# Patient Record
Sex: Female | Born: 1935 | Race: White | Hispanic: No | Smoking: Never smoker
Health system: Southern US, Community
[De-identification: ages and names within clinical notes are randomized; demographics above are authoritative.]

## PROBLEM LIST (undated history)

## (undated) DIAGNOSIS — I2699 Other pulmonary embolism without acute cor pulmonale: Secondary | ICD-10-CM

## (undated) DIAGNOSIS — G20A1 Parkinson's disease without dyskinesia, without mention of fluctuations: Secondary | ICD-10-CM

## (undated) DIAGNOSIS — F419 Anxiety disorder, unspecified: Secondary | ICD-10-CM

## (undated) DIAGNOSIS — I4891 Unspecified atrial fibrillation: Secondary | ICD-10-CM

## (undated) DIAGNOSIS — H3530 Unspecified macular degeneration: Secondary | ICD-10-CM

## (undated) DIAGNOSIS — G20 Parkinson's disease: Secondary | ICD-10-CM

## (undated) HISTORY — PX: EXTERNAL FIXATION ARM: SHX1552

## (undated) HISTORY — PX: CATARACT EXTRACTION: SUR2

---

## 2015-11-19 HISTORY — PX: BACK SURGERY: SHX140

## 2017-01-30 ENCOUNTER — Emergency Department (HOSPITAL_COMMUNITY): Payer: Medicare Other

## 2017-01-30 ENCOUNTER — Encounter (HOSPITAL_COMMUNITY): Payer: Self-pay | Admitting: Emergency Medicine

## 2017-01-30 ENCOUNTER — Observation Stay (HOSPITAL_COMMUNITY)
Admission: EM | Admit: 2017-01-30 | Discharge: 2017-02-01 | Disposition: A | Discharge status: Home / Self Care | Payer: Medicare Other | Attending: Internal Medicine | Admitting: Internal Medicine

## 2017-01-30 DIAGNOSIS — Z7901 Long term (current) use of anticoagulants: Secondary | ICD-10-CM | POA: Insufficient documentation

## 2017-01-30 DIAGNOSIS — Z86711 Personal history of pulmonary embolism: Secondary | ICD-10-CM | POA: Diagnosis not present

## 2017-01-30 DIAGNOSIS — H3530 Unspecified macular degeneration: Secondary | ICD-10-CM | POA: Insufficient documentation

## 2017-01-30 DIAGNOSIS — R233 Spontaneous ecchymoses: Secondary | ICD-10-CM | POA: Diagnosis not present

## 2017-01-30 DIAGNOSIS — Z79899 Other long term (current) drug therapy: Secondary | ICD-10-CM | POA: Diagnosis not present

## 2017-01-30 DIAGNOSIS — G20A1 Parkinson's disease without dyskinesia, without mention of fluctuations: Secondary | ICD-10-CM | POA: Diagnosis present

## 2017-01-30 DIAGNOSIS — F419 Anxiety disorder, unspecified: Secondary | ICD-10-CM | POA: Diagnosis not present

## 2017-01-30 DIAGNOSIS — R05 Cough: Secondary | ICD-10-CM | POA: Diagnosis present

## 2017-01-30 DIAGNOSIS — Z8249 Family history of ischemic heart disease and other diseases of the circulatory system: Secondary | ICD-10-CM | POA: Insufficient documentation

## 2017-01-30 DIAGNOSIS — R29898 Other symptoms and signs involving the musculoskeletal system: Secondary | ICD-10-CM | POA: Diagnosis present

## 2017-01-30 DIAGNOSIS — Z823 Family history of stroke: Secondary | ICD-10-CM | POA: Diagnosis not present

## 2017-01-30 DIAGNOSIS — G20 Parkinson's disease: Secondary | ICD-10-CM | POA: Insufficient documentation

## 2017-01-30 DIAGNOSIS — R4701 Aphasia: Secondary | ICD-10-CM | POA: Insufficient documentation

## 2017-01-30 DIAGNOSIS — G249 Dystonia, unspecified: Principal | ICD-10-CM | POA: Insufficient documentation

## 2017-01-30 DIAGNOSIS — I480 Paroxysmal atrial fibrillation: Secondary | ICD-10-CM | POA: Diagnosis not present

## 2017-01-30 DIAGNOSIS — R61 Generalized hyperhidrosis: Secondary | ICD-10-CM | POA: Diagnosis not present

## 2017-01-30 DIAGNOSIS — R059 Cough, unspecified: Secondary | ICD-10-CM

## 2017-01-30 HISTORY — DX: Unspecified macular degeneration: H35.30

## 2017-01-30 HISTORY — DX: Anxiety disorder, unspecified: F41.9

## 2017-01-30 HISTORY — DX: Other pulmonary embolism without acute cor pulmonale: I26.99

## 2017-01-30 HISTORY — DX: Parkinson's disease: G20

## 2017-01-30 HISTORY — DX: Parkinson's disease without dyskinesia, without mention of fluctuations: G20.A1

## 2017-01-30 HISTORY — DX: Unspecified atrial fibrillation: I48.91

## 2017-01-30 LAB — COMPREHENSIVE METABOLIC PANEL
ALT: 5 U/L — ABNORMAL LOW (ref 14–54)
ANION GAP: 8 (ref 5–15)
AST: 18 U/L (ref 15–41)
Albumin: 3.9 g/dL (ref 3.5–5.0)
Alkaline Phosphatase: 80 U/L (ref 38–126)
BILIRUBIN TOTAL: 0.5 mg/dL (ref 0.3–1.2)
BUN: 14 mg/dL (ref 6–20)
CO2: 25 mmol/L (ref 22–32)
Calcium: 9 mg/dL (ref 8.9–10.3)
Chloride: 106 mmol/L (ref 101–111)
Creatinine, Ser: 0.61 mg/dL (ref 0.44–1.00)
Glucose, Bld: 139 mg/dL — ABNORMAL HIGH (ref 65–99)
POTASSIUM: 4.3 mmol/L (ref 3.5–5.1)
Sodium: 139 mmol/L (ref 135–145)
TOTAL PROTEIN: 6.5 g/dL (ref 6.5–8.1)

## 2017-01-30 LAB — CBC WITH DIFFERENTIAL/PLATELET
Basophils Absolute: 0 10*3/uL (ref 0.0–0.1)
Basophils Relative: 1 %
EOS PCT: 1 %
Eosinophils Absolute: 0.1 10*3/uL (ref 0.0–0.7)
HEMATOCRIT: 33.5 % — AB (ref 36.0–46.0)
Hemoglobin: 11.1 g/dL — ABNORMAL LOW (ref 12.0–15.0)
LYMPHS PCT: 35 %
Lymphs Abs: 1.6 10*3/uL (ref 0.7–4.0)
MCH: 32.8 pg (ref 26.0–34.0)
MCHC: 33.1 g/dL (ref 30.0–36.0)
MCV: 99.1 fL (ref 78.0–100.0)
MONO ABS: 0.4 10*3/uL (ref 0.1–1.0)
MONOS PCT: 8 %
NEUTROS ABS: 2.5 10*3/uL (ref 1.7–7.7)
Neutrophils Relative %: 55 %
PLATELETS: 244 10*3/uL (ref 150–400)
RBC: 3.38 MIL/uL — ABNORMAL LOW (ref 3.87–5.11)
RDW: 14.4 % (ref 11.5–15.5)
WBC: 4.5 10*3/uL (ref 4.0–10.5)

## 2017-01-30 LAB — I-STAT CG4 LACTIC ACID, ED: LACTIC ACID, VENOUS: 0.76 mmol/L (ref 0.5–1.9)

## 2017-01-30 LAB — I-STAT CHEM 8, ED
BUN: 16 mg/dL (ref 6–20)
CREATININE: 0.6 mg/dL (ref 0.44–1.00)
Calcium, Ion: 1.1 mmol/L — ABNORMAL LOW (ref 1.15–1.40)
Chloride: 104 mmol/L (ref 101–111)
Glucose, Bld: 142 mg/dL — ABNORMAL HIGH (ref 65–99)
HEMATOCRIT: 32 % — AB (ref 36.0–46.0)
Hemoglobin: 10.9 g/dL — ABNORMAL LOW (ref 12.0–15.0)
POTASSIUM: 4.3 mmol/L (ref 3.5–5.1)
Sodium: 141 mmol/L (ref 135–145)
TCO2: 26 mmol/L (ref 0–100)

## 2017-01-30 LAB — I-STAT TROPONIN, ED: TROPONIN I, POC: 0 ng/mL (ref 0.00–0.08)

## 2017-01-30 MED ORDER — DIPHENHYDRAMINE HCL 50 MG/ML IJ SOLN
25.0000 mg | Freq: Once | INTRAMUSCULAR | Status: AC
  Administered 2017-01-30: 25 mg via INTRAVENOUS
  Filled 2017-01-30: qty 1

## 2017-01-30 MED ORDER — LORAZEPAM 2 MG/ML IJ SOLN: 1.0000 mg | Freq: Once | INTRAMUSCULAR | Status: DC

## 2017-01-30 NOTE — ED Provider Notes (Signed)
I saw and evaluated the patient, reviewed the resident's note and I agree with the findings and plan with the following exceptions.   81 year old female with Parkinson's here with spasms of her face and legs that started after taking Sinemet. States this has happened before with dosing as well. On exam here she does seem to have some dystonia of her lower extremity is better face appears normal. After discussion with neurology, plan will be for workup to make sure no other causes for altered mental status otherwise treated with Benadryl and reassess.   EKG Interpretation  Date/Time:  Thursday January 30 2017 23:55:46 EDT Ventricular Rate:  109 PR Interval:    QRS Duration: 69 QT Interval:  315 QTC Calculation: 425 R Axis:   71 Text Interpretation:  Sinus tachycardia Anterior infarct, old No previous tracing Confirmed by POLLINA  MD, CHRISTOPHER [phone]([id number]) on 01/31/2017 10:13:16 PM         Marily MemosJason Charletha Dalpe, MD 01/31/17 [id number]

## 2017-01-30 NOTE — ED Notes (Signed)
Pt refusing bloodwork at this time

## 2017-01-30 NOTE — ED Triage Notes (Signed)
Per EMS, pt from [facility], with c/o muscle rigidity while standing, pt also c/o mild headache which has resolved PTA and mild aphasia. Pt with hx of parkinson's, she states that these symptoms are normally associated with her parkinson's medications. Pt A&O x 4. BP-171/86, HR-90, RR-20, SpO2-98% room air.

## 2017-01-30 NOTE — ED Provider Notes (Signed)
[facility]-EMERGENCY DEPT Provider Note   CSN: [id number] Arrival date & time: 01/30/17  1854     History   Chief Complaint Chief Complaint  Patient presents with  . Spasms    HPI Nichole Krueger is a 81 y.o. female PMH Parkinson's disease, Afib p/w dystonic reaction. Pt reports Sinemet dosage increased one week ago. This evening she took her dose at 1700 at scheduled and approx 20 minutes later began to experience facial distortion, increased rigidity and contracture of b/l LE. Pt reports hx of similar reaction that normally dissipates after 30 min to 3 hours. Symptoms have stayed constant and patient presented to ED for further care.   The history is provided by the patient.  Neurologic Problem  This is a recurrent problem. The current episode started 1 to 2 hours ago. The problem occurs constantly. The problem has not changed since onset.Pertinent negatives include no chest pain, no abdominal pain, no headaches and no shortness of breath. Nothing relieves the symptoms.    Past Medical History:  Diagnosis Date  . Anxiety   . Atrial fibrillation (HCC)   . Macular degeneration   . Parkinson's disease (HCC)   . Pulmonary embolism [facility])     Patient Active Problem List   Diagnosis Date Noted  . Muscle rigidity 01/31/2017  . AF (paroxysmal atrial fibrillation) (HCC) 01/31/2017  . Parkinson's disease (HCC) 01/31/2017    Past Surgical History:  Procedure Laterality Date  . BACK SURGERY      OB History    No data available       Home Medications    Prior to Admission medications   Medication Sig Start Date End Date Taking? Authorizing Provider  Apixaban (ELIQUIS PO) Take by mouth.   Yes Historical Provider, MD  ALPRAZolam Prudy Feeler) 0.25 MG tablet  12/03/16   Historical Provider, MD  carbidopa-levodopa (SINEMET IR) 25-100 MG tablet  01/07/17   Historical Provider, MD  pramipexole (MIRAPEX) 1 MG tablet TK 1 T PO QD 01/08/17   Historical Provider, MD  selegiline (ELDEPRYL) 5 MG  capsule TK ONE C PO BID 01/03/17   Historical Provider, MD    Family History Family History  Problem Relation Age of Onset  . Stroke Mother   . Heart attack Father   . Heart attack Sister     Social History Social History  Substance Use Topics  . Smoking status: Never Smoker  . Smokeless tobacco: Never Used  . Alcohol use No     Allergies   Patient has no known allergies.   Review of Systems Review of Systems  Eyes: Negative for visual disturbance.  Respiratory: Negative for shortness of breath.   Cardiovascular: Negative for chest pain.  Gastrointestinal: Negative for abdominal pain.  Neurological: Positive for tremors. Negative for seizures, syncope, facial asymmetry, speech difficulty, weakness, light-headedness and headaches.       Rigidity and LE contractures  All other systems reviewed and are negative.    Physical Exam Updated Vital Signs BP (!) 146/70   Pulse 83   Temp 98.3 F (36.8 C) (Oral)   Resp 16   Ht 5\' 5"  (1.651 m)   Wt 52.2 kg   SpO2 97%   BMI 19.14 kg/m   Physical Exam  Constitutional: She is oriented to person, place, and time. She appears well-developed and well-nourished. She appears distressed.  HENT:  Head: Normocephalic and atraumatic.  Eyes: Conjunctivae and EOM are normal. Pupils are equal, round, and reactive to light.  Neck: Normal range  of motion. Neck supple.  Cardiovascular: Normal rate and regular rhythm.   Pulmonary/Chest: Effort normal and breath sounds normal. No respiratory distress.  Abdominal: Soft. There is no tenderness.  Neurological: She is alert and oriented to person, place, and time. No cranial nerve deficit. GCS eye subscore is 4. GCS verbal subscore is 5. GCS motor subscore is 6.  Resting tremor UE R>L. Increased tone throughout extremities with noted contracture b/l feet  Skin: Skin is warm and dry. She is not diaphoretic.  Psychiatric: She has a normal mood and affect.  Nursing note and vitals  reviewed.    ED Treatments / Results  Labs (all labs ordered are listed, but only abnormal results are displayed) Labs Reviewed  CBC WITH DIFFERENTIAL/PLATELET - Abnormal; Notable for the following:       Result Value   RBC 3.38 (*)    Hemoglobin 11.1 (*)    HCT 33.5 (*)    All other components within normal limits  COMPREHENSIVE METABOLIC PANEL - Abnormal; Notable for the following:    Glucose, Bld 139 (*)    ALT 5 (*)    All other components within normal limits  I-STAT CHEM 8, ED - Abnormal; Notable for the following:    Glucose, Bld 142 (*)    Calcium, Ion 1.10 (*)    Hemoglobin 10.9 (*)    HCT 32.0 (*)    All other components within normal limits  URINALYSIS, ROUTINE W REFLEX MICROSCOPIC  I-STAT TROPOININ, ED  I-STAT CG4 LACTIC ACID, ED    EKG  EKG Interpretation None       Radiology Dg Chest 2 View  Result Date: 01/31/2017 CLINICAL DATA:  Cough, headache, and muscle rigidity. Mild aphasia. History of Parkinson's. EXAM: CHEST  2 VIEW COMPARISON:  None. FINDINGS: Emphysematous changes in the lungs. Normal heart size and pulmonary vascularity. No focal airspace disease or consolidation. No blunting of costophrenic angles. No pneumothorax. Mediastinal contours appear intact. Postoperative changes with posterior fixation of the thoracolumbar spine. Multiple vertebral compression deformities with kyphoplasty changes. IMPRESSION: Emphysematous changes in the lungs. No evidence of active pulmonary disease. Electronically Signed   By: Burman Nieves M.D.   On: 01/31/2017 01:37   Ct Head Wo Contrast  Result Date: 01/30/2017 CLINICAL DATA:  Altered mental status. Muscle rigidity while standing. Mild headache and mild aphasia. History of Parkinson's. EXAM: CT HEAD WITHOUT CONTRAST TECHNIQUE: Contiguous axial images were obtained from the base of the skull through the vertex without intravenous contrast. COMPARISON:  None. FINDINGS: Brain: Examination is limited due to motion  and streak artifact. There is suggestion of some focal low-attenuation in the left posterior temporal region. This is probably due to streak artifact but does appear somewhat asymmetric and an early area of acute infarct cannot be excluded. Mild cerebral atrophy. Ventricles and sulci appear symmetrical otherwise. No mass effect or midline shift. No abnormal extra-axial fluid collections. Gray-white matter junctions are distinct. Basal cisterns are not effaced. No acute intracranial hemorrhage is identified. Vascular: Vascular calcifications are present. Skull: Calvarium appears intact. Sinuses/Orbits: Paranasal sinuses and mastoid air cells are clear. Other: None. IMPRESSION: Focal low-attenuation in the left posterior temporal region is probably due to streak artifact but early acute infarct is not entirely excluded. Otherwise, no evidence of acute intracranial abnormality. Mild atrophy. Electronically Signed   By: Burman Nieves M.D.   On: 01/30/2017 22:37    Procedures Procedures (including critical care time)  Medications Ordered in ED Medications  LORazepam (ATIVAN) injection 1  mg (not administered)  diphenhydrAMINE (BENADRYL) injection 25 mg (25 mg Intravenous Given 01/30/17 2252)  diphenhydrAMINE (BENADRYL) injection 25 mg (25 mg Intravenous Given 01/30/17 2331)  carbidopa-levodopa (SINEMET IR) 25-100 MG per tablet immediate release 2 tablet (2 tablets Oral Given 01/31/17 0049)     Initial Impression / Assessment and Plan / ED Course  I have reviewed the triage vital signs and the nursing notes.  Pertinent labs & imaging results that were available during my care of the patient were reviewed by me and considered in my medical decision making (see chart for details).    81 y.o. female p/w dystonic reaction. Pt is a poor historian. Concern for AMS vs Sinemet toxicity.  - CT head NAICA - CBC, CMP, lactic acid, troponin unremarkable - Given IV benadryl w/o relief Consult to neurology,  advise likely "off" period dystonia; advise Sinemet and reassess. Medication ordered. Will admit to Hospitalist for monitoring if ineffective. If medication resolved symptoms will discharge home with close Neurology follow-up. [facility].   Discussed with my attending physician, Dr Clayborne DanaMesner.  Final Clinical Impressions(s) / ED Diagnoses   Final diagnoses:  Cough    New Prescriptions New Prescriptions   No medications on file     Pablo LedgerElizabeth Mitchell Adriyanna Christians, MD 01/31/17 [id number]    Marily MemosJason Mesner, MD 01/31/17 [id number]

## 2017-01-31 ENCOUNTER — Encounter (HOSPITAL_COMMUNITY): Payer: Self-pay | Admitting: Family Medicine

## 2017-01-31 ENCOUNTER — Observation Stay (HOSPITAL_COMMUNITY): Payer: Medicare Other

## 2017-01-31 DIAGNOSIS — R05 Cough: Secondary | ICD-10-CM

## 2017-01-31 DIAGNOSIS — G20A1 Parkinson's disease without dyskinesia, without mention of fluctuations: Secondary | ICD-10-CM | POA: Diagnosis present

## 2017-01-31 DIAGNOSIS — G249 Dystonia, unspecified: Secondary | ICD-10-CM | POA: Diagnosis not present

## 2017-01-31 DIAGNOSIS — R29898 Other symptoms and signs involving the musculoskeletal system: Secondary | ICD-10-CM | POA: Diagnosis not present

## 2017-01-31 DIAGNOSIS — G20 Parkinson's disease: Secondary | ICD-10-CM | POA: Diagnosis present

## 2017-01-31 DIAGNOSIS — I480 Paroxysmal atrial fibrillation: Secondary | ICD-10-CM | POA: Diagnosis not present

## 2017-01-31 LAB — COMPREHENSIVE METABOLIC PANEL
AST: 15 U/L (ref 15–41)
Albumin: 3.3 g/dL — ABNORMAL LOW (ref 3.5–5.0)
Alkaline Phosphatase: 67 U/L (ref 38–126)
Anion gap: 9 (ref 5–15)
BILIRUBIN TOTAL: 0.6 mg/dL (ref 0.3–1.2)
BUN: 12 mg/dL (ref 6–20)
CO2: 28 mmol/L (ref 22–32)
CREATININE: 0.63 mg/dL (ref 0.44–1.00)
Calcium: 8.8 mg/dL — ABNORMAL LOW (ref 8.9–10.3)
Chloride: 106 mmol/L (ref 101–111)
GFR calc Af Amer: >60 mL/min (ref >60)
GFR calc non Af Amer: >60 mL/min (ref >60)
Glucose, Bld: 113 mg/dL — ABNORMAL HIGH (ref 65–99)
Potassium: 4.1 mmol/L (ref 3.5–5.1)
Sodium: 143 mmol/L (ref 135–145)
TOTAL PROTEIN: 5.7 g/dL — AB (ref 6.5–8.1)

## 2017-01-31 LAB — CBC
HCT: 30.8 % — ABNORMAL LOW (ref 36.0–46.0)
Hemoglobin: 10.2 g/dL — ABNORMAL LOW (ref 12.0–15.0)
MCH: 33.1 pg (ref 26.0–34.0)
MCHC: 33.1 g/dL (ref 30.0–36.0)
MCV: 100 fL (ref 78.0–100.0)
PLATELETS: 195 10*3/uL (ref 150–400)
RBC: 3.08 MIL/uL — ABNORMAL LOW (ref 3.87–5.11)
RDW: 14.5 % (ref 11.5–15.5)
WBC: 4.4 10*3/uL (ref 4.0–10.5)

## 2017-01-31 LAB — PROTIME-INR
INR: 1.18
PROTHROMBIN TIME: 15.1 s (ref 11.4–15.2)

## 2017-01-31 MED ORDER — APIXABAN 5 MG PO TABS
5.0000 mg | ORAL_TABLET | Freq: Two times a day (BID) | ORAL | Status: DC
  Administered 2017-01-31 – 2017-02-01 (×3): 5 mg via ORAL
  Filled 2017-01-31 (×3): qty 1

## 2017-01-31 MED ORDER — CARBIDOPA-LEVODOPA 25-100 MG PO TABS: 0.5000 | ORAL_TABLET | Freq: Every day | ORAL | Status: DC

## 2017-01-31 MED ORDER — LORAZEPAM 2 MG/ML IJ SOLN
1.0000 mg | INTRAMUSCULAR | Status: DC | PRN
  Administered 2017-01-31: 1 mg via INTRAVENOUS
  Filled 2017-01-31: qty 1

## 2017-01-31 MED ORDER — CARBIDOPA-LEVODOPA 25-100 MG PO TABS
2.0000 | ORAL_TABLET | Freq: Four times a day (QID) | ORAL | Status: DC
  Administered 2017-01-31 – 2017-02-01 (×7): 2 via ORAL
  Filled 2017-01-31 (×7): qty 2

## 2017-01-31 MED ORDER — CARBIDOPA-LEVODOPA 25-100 MG PO TABS
1.0000 | ORAL_TABLET | Freq: Every day | ORAL | Status: DC
  Administered 2017-01-31: 1 via ORAL
  Filled 2017-01-31: qty 1

## 2017-01-31 MED ORDER — CARBIDOPA-LEVODOPA 25-100 MG PO TABS
2.0000 | ORAL_TABLET | Freq: Once | ORAL | Status: AC
  Administered 2017-01-31: 2 via ORAL
  Filled 2017-01-31: qty 2

## 2017-01-31 MED ORDER — CARBIDOPA-LEVODOPA ER 50-200 MG PO TBCR
1.0000 | EXTENDED_RELEASE_TABLET | Freq: Every day | ORAL | Status: DC
  Administered 2017-01-31: 1 via ORAL
  Filled 2017-01-31: qty 1

## 2017-01-31 MED ORDER — ACETAMINOPHEN 325 MG PO TABS
650.0000 mg | ORAL_TABLET | Freq: Four times a day (QID) | ORAL | Status: DC | PRN
  Administered 2017-01-31: 650 mg via ORAL
  Filled 2017-01-31: qty 2

## 2017-01-31 MED ORDER — OXYCODONE-ACETAMINOPHEN 5-325 MG PO TABS
1.0000 | ORAL_TABLET | ORAL | Status: DC | PRN
  Administered 2017-01-31: 1 via ORAL
  Filled 2017-01-31: qty 1

## 2017-01-31 MED ORDER — ACETAMINOPHEN 650 MG RE SUPP: 650.0000 mg | Freq: Four times a day (QID) | RECTAL | Status: DC | PRN

## 2017-01-31 MED ORDER — SODIUM CHLORIDE 0.9 % IV SOLN
INTRAVENOUS | Status: DC
  Administered 2017-01-31 (×2): via INTRAVENOUS
  Administered 2017-01-31: 1000 mL via INTRAVENOUS
  Administered 2017-02-01: 09:00:00 via INTRAVENOUS

## 2017-01-31 MED ORDER — BACLOFEN 10 MG PO TABS: 10.0000 mg | ORAL_TABLET | Freq: Three times a day (TID) | ORAL | Status: DC | PRN

## 2017-01-31 MED ORDER — LORAZEPAM 2 MG/ML IJ SOLN: 1.0000 mg | Freq: Once | INTRAMUSCULAR | Status: DC

## 2017-01-31 MED ORDER — PRAMIPEXOLE DIHYDROCHLORIDE 0.25 MG PO TABS
0.2500 mg | ORAL_TABLET | Freq: Four times a day (QID) | ORAL | Status: DC
  Administered 2017-01-31 – 2017-02-01 (×6): 0.25 mg via ORAL
  Filled 2017-01-31 (×7): qty 1

## 2017-01-31 NOTE — Progress Notes (Signed)
Pt arrived to 5w30 accompanied by ED Nurse.   Alert and oriented x 4, VS collected, no signs of acute distress. Patient advised about valuable policy and oriented to room and equipment.  Bed alarm placed and pt instructed to use call light for any assistance.   Call light within reach.   Will continue to monitor and treat patient.

## 2017-01-31 NOTE — Progress Notes (Signed)
[facility] PLAN OF CARE NOTE Patient: Nichole Krueger XBM:[id number]   PCP: Ginette OttoSTONEKING,HAL THOMAS, MD DOB: 17-Jun-1936   DOA: 01/30/2017   DOS: 01/31/2017    Patient was admitted by my colleague Dr. Maryfrances Bunnellanford earlier on 01/31/2017. I have reviewed the H&P as well as assessment and plan and agree with the same. Important changes in the plan are listed below.  Plan of care: Principal Problem:   Muscle rigidity Active Problems:   AF (paroxysmal atrial fibrillation) (HCC)   Parkinson's disease (HCC) Continue medical treatment recommended by neurology.  Author: Lynden OxfordPranav Tanekia Ryans, MD [facility] Pager: [phone] 01/31/2017 4:55 PM   If 7PM-7AM, please contact night-coverage at [email or web address], password [email or web address]

## 2017-01-31 NOTE — H&P (Signed)
History and Physical  Patient Name: Nichole Krueger     GLO:[id number]    DOB: [birth year]    DOA: 01/30/2017 PCP: Ginette OttoSTONEKING,HAL THOMAS, MD   [facility]  Chief Complaint: Rigidity  HPI: Nichole Krueger is a [age] y.o. female with a past medical history significant for Parkinson's disease, atrial fibrillation and recurrent DVT on Eliquis who presents with rigidity tonght.  The patient recently had her dose of Sinemet increased.  Tonight, shortly after taking her nightly Sinemet, she developed painful tightening of the muscles in her legs and also spasms in her face.  She assumed this was related to her Sinemet, because she has had similar episodes in the past, so she came to the ER.  ED course: -Afebrile, heart rate 91, respirations and pulse ox normal, BP 148/84 -Na 139, K 4.3, Cr 0.61, LFTs normal, WBC 4.5K, Hgb 11.1 -Troponin negative, lactic acid normal -CT head showed streak artifact vs infarct, latter doubted by Neurology -Initially the consideration was that this was a peak dose dystonia, but when diphenhydramine did not improve symptoms, it was thought that maybe she missed her dose by accident and this was "wearing off" bradykinesis and so she was given an extra dose of Sinemet and TR[facility] were asked to observe the patient overnight    [place] [facility] [place].  She lives in independent living at [facility].  In addition to her recent dose increase, she notes a new cough recently, with some sputum production.  She has had no fevers, chills, dysuria, urinary frequency other urinary symptoms.  Currently, she notes malaise, and muscle rigidity, and seems somewhat agitated, but has no focal complaints.       ROS: Review of Systems  Constitutional: Positive for diaphoresis. Negative for chills and fever.  Respiratory: Positive for cough and sputum production. Negative for shortness of breath  and wheezing.   Cardiovascular: Negative for chest pain.  Gastrointestinal: Negative for abdominal pain, nausea and vomiting.  Genitourinary: Negative for dysuria, flank pain, frequency, hematuria and urgency.  Skin: Positive for rash (she was not aware, petechial, left arm only).  Neurological: Positive for tremors. Negative for sensory change, speech change, focal weakness, seizures and loss of consciousness.  Psychiatric/Behavioral: The patient is nervous/anxious.   All other systems reviewed and are negative.         Past Medical History:  Diagnosis Date  . Anxiety   . Atrial fibrillation (HCC)   . Macular degeneration   . Parkinson's disease (HCC)   . Pulmonary embolism [facility](HCC)     Past Surgical History:  Procedure Laterality Date  . BACK SURGERY      Social History: [facility] the independent living part.  The patient walks with a walker at baseline. She has no dementia.  She is not a smoker.    No Known Allergies  Family history: family history includes Heart attack in her father and sister; Stroke in her mother.  Prior to Admission medications   Medication Sig Start Date End Date Taking? Authorizing Provider  Apixaban (ELIQUIS PO) Take by mouth.   Yes Historical Provider, MD  ALPRAZolam Prudy Feeler(XANAX) 0.25 MG tablet  12/03/16   Historical Provider, MD  carbidopa-levodopa (SINEMET IR) 25-100 MG tablet  01/07/17   Historical Provider, MD  pramipexole (MIRAPEX) 1 MG tablet TK 1 T PO QD 01/08/17   Historical Provider, MD  selegiline (ELDEPRYL) 5 MG  capsule TK ONE C PO BID 01/03/17   Historical Provider, MD       Physical Exam: BP (!) 146/70   Pulse 83   Temp 98.3 F (36.8 C) (Oral)   Resp 16   Ht 5\' 5"  (1.651 m)   Wt 52.2 kg (115 lb)   SpO2 97%   BMI 19.14 kg/m  General appearance: Thin elderly adult female, awake and oriented but agitated and diaphoretic on my first exam.  1hr after Sinemet, she was no longer diaphoretic, appeared  more comfortable, interactive.   Eyes: Anicteric, conjunctiva inflamed, lids and lashes normal. PERRL. EOMI, no oc clonus. ENT: No nasal deformity, discharge, epistaxis.  Hearing normal. OP moist without lesions.   Skin: Initially diaphoretic, later normal, warm.  No jaundice.  The right arm, and hand are ruddy, and the right forearm has many tiny nonblanching red macules, not present on the left arm, face, neck, trunk, legs, groin. Cardiac: RRR, nl S1-S2, no murmurs appreciated.  Capillary refill is brisk.  JVP normal.  No LE edema.  Radial and DP pulses 2+ and symmetric. Respiratory: Normal respiratory rate and rhythm.  CTAB without rales or wheezes. Abdomen: Abdomen soft.  No TTP. No ascites, distension, hepatosplenomegaly.   MSK: No deformities or effusions.  No cyanosis or clubbing. Neuro: Cranial nerves normal.  Sensation intact to light touch. Speech is fluent.  Muscle strength equal and 5/5. Mild resting tremor.  No clonus at rest or inducible.  Mild pill-rolling trmor on right.    Psych: Sensorium intact and responding to questions, attention normal.  Able to recite medicines well.  Behavior appropriate.  Affect blunted.  Judgment and insight appear normal.     Labs on Admission:  I have personally reviewed following labs and imaging studies: CBC:  Recent Labs Lab 01/30/17 2213 01/30/17 2232  WBC 4.5  --   NEUTROABS 2.5  --   HGB 11.1* 10.9*  HCT 33.5* 32.0*  MCV 99.1  --   PLT 244  --    Basic Metabolic Panel:  Recent Labs Lab 01/30/17 2213 01/30/17 2232  NA 139 141  K 4.3 4.3  CL 106 104  CO2 25  --   GLUCOSE 139* 142*  BUN 14 16  CREATININE 0.61 0.60  CALCIUM 9.0  --    GFR: Estimated Creatinine Clearance: 47.8 mL/min (by C-G formula based on SCr of 0.6 mg/dL).  Liver Function Tests:  Recent Labs Lab 01/30/17 2213  AST 18  ALT 5*  ALKPHOS 80  BILITOT 0.5  PROT 6.5  ALBUMIN 3.9   No results for input(s): LIPASE, AMYLASE in the last 168 hours. No  results for input(s): AMMONIA in the last 168 hours. Coagulation Profile: No results for input(s): INR, PROTIME in the last 168 hours. Cardiac Enzymes: No results for input(s): CKTOTAL, CKMB, CKMBINDEX, TROPONINI in the last 168 hours. BNP (last 3 results) No results for input(s): PROBNP in the last 8760 hours. HbA1C: No results for input(s): HGBA1C in the last 72 hours. CBG: No results for input(s): GLUCAP in the last 168 hours. Lipid Profile: No results for input(s): CHOL, HDL, LDLCALC, TRIG, CHOLHDL, LDLDIRECT in the last 72 hours. Thyroid Function Tests: No results for input(s): TSH, T4TOTAL, FREET4, T3FREE, THYROIDAB in the last 72 hours. Anemia Panel: No results for input(s): VITAMINB12, FOLATE, FERRITIN, TIBC, IRON, RETICCTPCT in the last 72 hours. Sepsis Labs: Lactic acid 0.76 Invalid input(s): PROCALCITONIN, LACTICIDVEN No results found for this or any previous visit (from the past  240 hour(s)).       Radiological Exams on Admission: Personally reviewed CXR shows no focal opacity or pneumonia; CT head report reviewed: Dg Chest 2 View  Result Date: 01/31/2017 CLINICAL DATA:  Cough, headache, and muscle rigidity. Mild aphasia. History of Parkinson's. EXAM: CHEST  2 VIEW COMPARISON:  None. FINDINGS: Emphysematous changes in the lungs. Normal heart size and pulmonary vascularity. No focal airspace disease or consolidation. No blunting of costophrenic angles. No pneumothorax. Mediastinal contours appear intact. Postoperative changes with posterior fixation of the thoracolumbar spine. Multiple vertebral compression deformities with kyphoplasty changes. IMPRESSION: Emphysematous changes in the lungs. No evidence of active pulmonary disease. Electronically Signed   By: Burman Nieves M.D.   On: 01/31/2017 01:37   Ct Head Wo Contrast  Result Date: 01/30/2017 CLINICAL DATA:  Altered mental status. Muscle rigidity while standing. Mild headache and mild aphasia. History of Parkinson's.  EXAM: CT HEAD WITHOUT CONTRAST TECHNIQUE: Contiguous axial images were obtained from the base of the skull through the vertex without intravenous contrast. COMPARISON:  None. FINDINGS: Brain: Examination is limited due to motion and streak artifact. There is suggestion of some focal low-attenuation in the left posterior temporal region. This is probably due to streak artifact but does appear somewhat asymmetric and an early area of acute infarct cannot be excluded. Mild cerebral atrophy. Ventricles and sulci appear symmetrical otherwise. No mass effect or midline shift. No abnormal extra-axial fluid collections. Gray-white matter junctions are distinct. Basal cisterns are not effaced. No acute intracranial hemorrhage is identified. Vascular: Vascular calcifications are present. Skull: Calvarium appears intact. Sinuses/Orbits: Paranasal sinuses and mastoid air cells are clear. Other: None. IMPRESSION: Focal low-attenuation in the left posterior temporal region is probably due to streak artifact but early acute infarct is not entirely excluded. Otherwise, no evidence of acute intracranial abnormality. Mild atrophy. Electronically Signed   By: Burman Nieves M.D.   On: 01/30/2017 22:37    EKG: Independently reviewed. Rate 109, QTc 425, no ST changes.    Assessment/Plan  1. Muscle rigidity:  Suspected wearing off of Sinemet, per Neurology.  Her diaphoresis and MAOI use raise the question of serotonin syndrome, but she has no clonus or ocular clonus so this is doubted.  Not NMS (she is on dopamine agonists).  -Continue home Sinemet in addition to dose given in ER -Consult to Neurology, appreciate cares   2. Parkinson's disease:  -Continue Sinemet -Continue pramipexole -Hold selegiline until have had time to observe and it is clearer that this is not Serotonin syndrome  3. Atrial fibrillation:  CHADS2Vasc 3. -Continue Eliquis  4. Rash:  Fine petechiae on right arm.  Distribution odd.  She was  not aware of it before.   -Monitor              DVT prophylaxis: N/a on Eliquis Code Status: FULL  Family Communication: None present  Disposition Plan: Anticipate monitor after dose of Sinemet; if improving, presumably home today. Consults called: Neurology Admission status: OBS At the point of initial evaluation, it is my clinical opinion that admission for OBSERVATION is reasonable and necessary because the patient's presenting complaints in the context of their chronic conditions represent sufficient risk of deterioration or significant morbidity to constitute reasonable grounds for close observation in the hospital setting, but that the patient may be medically stable for discharge from the hospital within 24 to 48 hours.    Medical decision making: Patient seen at 1:52 AM on 01/31/2017.  The patient was discussed with Dr.  Caudill.  What exists of the patient's chart was reviewed in depth and summarized above.  Clinical condition: stable.        Alberteen Sam [facility] Pager [phone]

## 2017-01-31 NOTE — ED Notes (Addendum)
Per hospitalist verbal order. Hold off on the Ativan until Sinemet is given. If pt does not react well to Sinemet, then go ahead w/ Ativan.

## 2017-01-31 NOTE — Progress Notes (Signed)
Spoke with patient and she has improved from last night to a great extent but she is afraid that this might happen again today and would prefer to stay and see if the medications we are prescribing will help.  She is very concerned because the pain is too severe and makes her wish "got would take her".  I am going to be very aggressive with pain management.  I have prescribed baclofen PRN for leg spasms, as well as ativan and percocet as needed for this purpose.  I spoke with the patient's nurse so she is aware of the need for these medicines.  I spoke with pharmacy to please fix the way patient takes her medicantions at home so we can reproduce her regiment here.  Finally, I spoke with patient at length regarding a re-visit with [facility] for possible surgical treatment of PD as she appears to be quite advanced.  Please note that pt is beginning to demonstrate slight paranoia (from mirapex), and this is expected from this medicine.  She expressed distrust in the nurse from overnight because she thought she was given "no medicines in her water", which she found out because "the water tasted like clear water, without anything in it" - saying the nurse pocketed the medicines.  Obviously this is a hallucination as we do not give medicines in water.  If this continues or becomes an issue, use SEROQUEL and never haldol or other antipsychotics.

## 2017-01-31 NOTE — Evaluation (Signed)
Physical Therapy Evaluation Patient Details Name: Nichole Krueger MRN: [id number] DOB: 05/30/1936 Today's Date: 01/31/2017   History of Present Illness  81 y.o. female with a past medical history significant for Parkinson's disease, atrial fibrillation and recurrent DVT on Eliquis who presented to the ED with muscle rigidity.  Clinical Impression  Pt admitted with above diagnosis. Pt currently with functional limitations due to the deficits listed below (see PT Problem List). On eval, pt with noted dystonia BUE/LE. She required min guard assist with transfers and ambulation 150 feet with RW. Pt will benefit from skilled PT to increase their independence and safety with mobility to allow discharge to the venue listed below.       Follow Up Recommendations Home health PT;Supervision - Intermittent    Equipment Recommendations  None recommended by PT    Recommendations for Other Services       Precautions / Restrictions Precautions Precautions: Fall Restrictions Weight Bearing Restrictions: No      Mobility  Bed Mobility Overal bed mobility: Needs Assistance Bed Mobility: Rolling;Supine to Sit;Sit to Supine Rolling: Modified independent (Device/Increase time)   Supine to sit: HOB elevated;Supervision Sit to supine: HOB elevated;Supervision   General bed mobility comments: supervision for safety only  Transfers Overall transfer level: Needs assistance Equipment used: Rolling walker (2 wheeled) Transfers: Sit to/from Stand Sit to Stand: Min guard         General transfer comment: min guard for safety  Ambulation/Gait Ambulation/Gait assistance: Min guard Ambulation Distance (Feet): 150 Feet   Gait Pattern/deviations: Step-through pattern;Decreased stride length Gait velocity: decreased Gait velocity interpretation: Below normal speed for age/gender General Gait Details: dystonia decreases with mobility  Stairs            Wheelchair Mobility    Modified  Rankin (Stroke Patients Only)       Balance Overall balance assessment: Needs assistance Sitting-balance support: No upper extremity supported;Feet supported Sitting balance-Leahy Scale: Good     Standing balance support: Bilateral upper extremity supported;During functional activity Standing balance-Leahy Scale: Fair                               Pertinent Vitals/Pain Pain Assessment: Faces Faces Pain Scale: Hurts a little bit Pain Location: bilat ankles Pain Descriptors / Indicators: Sore Pain Intervention(s): Monitored during session    Home Living Family/patient expects to be discharged to:: Other (Comment) (Independent Living at [facility], [place]) Living Arrangements: Alone Available Help at Discharge: Available PRN/intermittently;Friend(s);Family;Other (Comment) (ILF staff) Type of Home: Independent living facility Home Access: Elevator     Home Layout: One level Home Equipment: Walker - 2 wheels;Walker - 4 wheels      Prior Function Level of Independence: Independent with assistive device(s)         Comments: Ambulates with RW. Able to walk to dining room for meals. Independent with bADLs.     Hand Dominance        Extremity/Trunk Assessment   Upper Extremity Assessment Upper Extremity Assessment: RUE deficits/detail;LUE deficits/detail RUE Deficits / Details: dystonia noted BUE, abnormal muscle tone    Lower Extremity Assessment Lower Extremity Assessment: RLE deficits/detail;LLE deficits/detail RLE Deficits / Details: dystonia noted BUE, abnormal muscle tone    Cervical / Trunk Assessment Cervical / Trunk Assessment: Kyphotic  Communication   Communication: No difficulties  Cognition Arousal/Alertness: Awake/alert Behavior During Therapy: WFL for tasks assessed/performed Overall Cognitive Status: Within Functional Limits for tasks assessed  General Comments      Exercises     Assessment/Plan    PT  Assessment Patient needs continued PT services  PT Problem List Decreased activity tolerance;Decreased balance;Decreased mobility;Decreased coordination;Impaired tone       PT Treatment Interventions DME instruction;Gait training;Functional mobility training;Balance training;Therapeutic activities;Patient/family education;Therapeutic exercise    PT Goals (Current goals can be found in the Care Plan section)  Acute Rehab PT Goals Patient Stated Goal: find out why this is happening PT Goal Formulation: With patient Time For Goal Achievement: 02/14/17 Potential to Achieve Goals: Good    Frequency Min 3X/week   Barriers to discharge        Co-evaluation               End of Session Equipment Utilized During Treatment: Gait belt Activity Tolerance: Patient tolerated treatment well Patient left: in bed;with bed alarm set;with call bell/phone within reach Nurse Communication: Mobility status PT Visit Diagnosis: Other abnormalities of gait and mobility (R26.89)    Functional Assessment Tool Used: AM-PAC 6 Clicks Basic Mobility Functional Limitation: Mobility: Walking and moving around Mobility: Walking and Moving Around Current Status ([id number](G8978): At least 20 percent but less than 40 percent impaired, limited or restricted Mobility: Walking and Moving Around Goal Status [phone](G8979): At least 20 percent but less than 40 percent impaired, limited or restricted    Time: 1030-1056 PT Time Calculation (min) (ACUTE ONLY): 26 min   Charges:   PT Evaluation $PT Eval Moderate Complexity: 1 Procedure PT Treatments $Gait Training: 8-22 mins   PT G Codes:   PT G-Codes **NOT FOR INPATIENT CLASS** Functional Assessment Tool Used: AM-PAC 6 Clicks Basic Mobility Functional Limitation: Mobility: Walking and moving around Mobility: Walking and Moving Around Current Status ([id number](G8978): At least 20 percent but less than 40 percent impaired, limited or restricted Mobility: Walking and Moving Around  Goal Status [phone]([id number]): At least 20 percent but less than 40 percent impaired, limited or restricted     Ilda FoilGarrow, Harlyn Rathmann Rene 01/31/2017, 11:21 AM

## 2017-01-31 NOTE — Consult Note (Signed)
Neurology Consultation Reason for Consult: Dystonia Referring Physician: Messner  CC: Dystonia  History is obtained from: Patient  HPI: Nichole Krueger is a 81 y.o. female with a history of Parkinson's disease who presents with dystonia that started earlier this evening. She states that she thinks she  Took a dose of Sinemet and then walked down to her medial where she began to experience symptoms that she initially for her Parkinson's. He is going, however, they became painful and she experienced tightening of her muscles.  She states that she has experienced this before, both after taking doses of medications which lasts for about 20-30 minutes, as well as in the hospital when her medications were "messed up." By this she means not given at appropriate times  On arrival, this was discussed with me and it sounded like peak dose dystonia, and since it was lasting only than expected I advised that Benadryl could be tried. This has not helped.  ROS: A 14 point ROS was performed and is negative except as noted in the HPI.   Past Medical History:  Diagnosis Date  . Anxiety   . Atrial fibrillation (HCC)   . Macular degeneration   . Parkinson's disease (HCC)   . Pulmonary embolism (HCC)      Family History  Problem Relation Age of Onset  . Stroke Mother   . Heart attack Father   . Heart attack Sister      Social History:  reports that she has never smoked. She has never used smokeless tobacco. She reports that she does not drink alcohol or use drugs.   Exam: Current vital signs: BP (!) 146/70   Pulse 83   Temp 98.3 F (36.8 C) (Oral)   Resp 16   Ht 5\' 5"  (1.651 m)   Wt 52.2 kg (115 lb)   SpO2 97%   BMI 19.14 kg/m  Vital signs in last 24 hours: Temp:  [98.3 F (36.8 C)] 98.3 F (36.8 C) (03/15 1902) Pulse Rate:  [83-91] 83 (03/15 1915) Resp:  [16-18] 16 (03/15 1915) BP: (146-148)/(70-84) 146/70 (03/15 1915) SpO2:  [97 %-98 %] 97 % (03/15 1915) Weight:  [52.2 kg (115  lb)] 52.2 kg (115 lb) (03/15 1858)   Physical Exam  Constitutional: Appears well-developed and well-nourished.  Psych: Affect appropriate to situation Eyes: No scleral injection HENT: No OP obstrucion Head: Normocephalic.  Cardiovascular: Normal rate and regular rhythm.  Respiratory: Effort normal and breath sounds normal to anterior ascultation GI: Soft.  No distension. There is no tenderness.  Skin: WDI  Neuro: Mental Status: Patient is awake, alert, oriented to person, place, month, year, and situation. Patient is able to give a clear and coherent history. No signs of aphasia or neglect Cranial Nerves: II: Visual Fields are full. Pupils are equal, round, and reactive to light.   III,IV, VI: EOMI without ptosis or diploplia.  V: Facial sensation is symmetric to temperature VII: Facial movement is symmetric.  VIII: hearing is intact to voice X: Uvula elevates symmetrically XI: Shoulder shrug is symmetric. XII: tongue is midline without atrophy or fasciculations.  Motor: She has increased tone throughout her extremities, resting tremor right side >left side involving both the arm and leg, she has dystonic posturing of her feet bilaterally. Sensory: Sensation is symmetric to light touch and temperature in the arms and legs. Cerebellar: FNF intact bilaterally  I have reviewed labs in epic and the results pertinent to this consultation are: Chem 8-unremarkable  I have reviewed the images  obtained: CT head-no definite acute findings  Impression: 81 year old female with Parkinson's related dystonia. Initially, this seemed like it was likely a peak dose related phenomenon given the timing of starting after a dose. The duration of lasting symptoms, however, would argue strongly against that.    I do wonder if she actually forgot to take her dose, or took less than intended and is actually experiencing a "off" period dystonia.  Recommendations: 1) Stat Sinemet 2 tabs of 25/100.   2) Resume home dose sinemet.  3) If she improves following dose of sinemet, no further treatment needed. If not, could try low dose benzodiazepine.    Ritta Slot, MD [facility] [phone]  If 7pm- 7am, please page neurology on call as listed in AMION.

## 2017-01-31 NOTE — Care Management Obs Status (Signed)
MEDICARE OBSERVATION STATUS NOTIFICATION   Patient Details  Name: Iver Nestleancy Cossey MRN: [id number] Date of Birth: Nov 06, 1936   Medicare Observation Status Notification Given:  Yes    Epifanio LeschesCole, Ahley Bulls Hudson, RN 01/31/2017, 4:03 PM

## 2017-02-01 DIAGNOSIS — R29898 Other symptoms and signs involving the musculoskeletal system: Secondary | ICD-10-CM | POA: Diagnosis not present

## 2017-02-01 MED ORDER — BACLOFEN 10 MG PO TABS: 10.0000 mg | ORAL_TABLET | Freq: Three times a day (TID) | ORAL | 0 refills | Status: AC | PRN

## 2017-02-01 MED ORDER — HYDROCODONE-ACETAMINOPHEN 5-325 MG PO TABS: 1.0000 | ORAL_TABLET | Freq: Four times a day (QID) | ORAL | 0 refills | Status: DC | PRN

## 2017-02-01 NOTE — Care Management Note (Signed)
Case Management Note  Patient Details  Name: Nichole Krueger MRN: [id number] Date of Birth: 1936/08/05  Subjective/Objective:                 Patient with DC order to home[facility]. [facility]. Spoke with Durwin Noraebecca Gallimore [phone], and she set up [facility] who will pick up patient at 2:00 in front of [address]. Bedside nurse Shinita aware to have patient there at that time. [facility]    Action/Plan:   Expected Discharge Date:  02/01/17               Expected Discharge Plan:  Home w Home Health Services  In-House Referral:     Discharge planning Services  CM Consult  Post Acute Care Choice:    Choice offered to:  Patient  DME Arranged:    DME Agency:     HH Arranged:  PT, OT, RN HH Agency:   Chip Boer([facility])  Status of Service:  In process, will continue to follow  If discussed at Long Length of Stay Meetings, dates discussed:    Additional Comments:  Lawerance SabalDebbie Yaneli Keithley, RN 02/01/2017, 12:41 PM

## 2017-02-01 NOTE — Progress Notes (Signed)
Patient discharge instructions giving. Patient AVS giving, and patient verbalized understand instuctions.

## 2017-02-07 NOTE — Discharge Summary (Signed)
[facility] Discharge Summary   Patient: Nichole Krueger LOV:[id number]   PCP: Ginette OttoSTONEKING,HAL THOMAS, MD DOB: 03-Aug-1936   Date of admission: 01/30/2017   Date of discharge: 02/01/2017    Discharge Diagnoses:  Principal Problem:   Muscle rigidity Active Problems:   AF (paroxysmal atrial fibrillation) (HCC)   Parkinson's disease (HCC)   Admitted From: ALF Disposition:  ALF  Recommendations for Outpatient Follow-up:  1. Please follow up with PCP in 1 week   Follow-up Information    Ginette OttoSTONEKING,HAL THOMAS, MD. Schedule an appointment as soon as possible for a visit in 1 week(s).   Specialty:  Internal Medicine Contact information: [address] [place] [address] [phone]        Anastasia FiedlerSIDDIQUI,MUSTAFA Saad, MD. Schedule an appointment as soon as possible for a visit in 1 month(s).   Specialty:  Neurology Contact information: [address] [place] [address] [phone]        [facility].   Specialty:  Home Health Services Why:  For home health RN PT OT Contact information: [address] [place] [phone]          Diet recommendation: cardiac diet  Activity: The patient is advised to gradually reintroduce usual activities.  Discharge Condition: good  Code Status: full code  History of present illness: As per the H and P dictated on admission, "Nichole Nestleancy Taaffe is a 81 y.o. female with a past medical history significant for Parkinson's disease, atrial fibrillation and recurrent DVT on Eliquis who presents with rigidity tonght.  The patient recently had her dose of Sinemet increased.  Tonight, shortly after taking her nightly Sinemet, she developed painful tightening of the muscles in her legs and also spasms in her face.  She assumed this was related to her Sinemet, because she has had similar episodes in the past, so she came to the ER."  Hospital Course:  Summary of  her active problems in the hospital is as following. 1. Muscle rigidity:  Suspected wearing off of Sinemet, per Neurology.  Her diaphoresis and MAOI use raise the question of serotonin syndrome, but she has no clonus or ocular clonus so this is doubted.  Not NMS (she is on dopamine agonists).  -Continue home Sinemet -Consult to Neurology, appreciate cares, end stage parkinson, they recommend to ensure her symptoms are well controlled, therefor baclofen and tramadol on discharge. Also Recomm[facility] for further care.  2. Parkinson's disease:  -Continue Sinemet -Continue pramipexole  3. Atrial fibrillation:  CHADS2Vasc 3. -Continue Eliquis  4. Rash:  Fine petechiae on right arm.  Distribution odd.  She was not aware of it before.   -Monitor  All other chronic medical condition were stable during the hospitalization.  Patient was seen by physical therapy, who recommended home health, which was arranged by Child psychotherapistsocial worker and case Production designer, theatre/television/filmmanager. On the day of the discharge the patient's vitals were stable, and no other acute medical condition were reported by patient. the patient was felt safe to be discharge at ALF with therapy.  Procedures and Results:  none   Consultations:  Neurology  DISCHARGE MEDICATION: Discharge Medication List as of 02/01/2017  1:51 PM    START taking these medications   Details  baclofen (LIORESAL) 10 MG tablet Take 1 tablet (10 mg total) by mouth 3 (three) times daily as needed for muscle spasms (leg muscle spasms)., Starting Sat 02/01/2017, Print    HYDROcodone-acetaminophen (  NORCO) 5-325 MG tablet Take 1 tablet by mouth every 6 (six) hours as needed for moderate pain., Starting Sat 02/01/2017, Print      CONTINUE these medications which have NOT CHANGED   Details  ALPRAZolam (XANAX) 0.25 MG tablet Take 0.25 mg by mouth every morning. , Starting Tue 12/03/2016, Historical Med    apixaban (ELIQUIS) 5 MG TABS tablet Take 5 mg by  mouth 2 (two) times daily., Historical Med    carbidopa-levodopa (SINEMET CR) 50-200 MG tablet Take 2 tablets by mouth at bedtime. , Historical Med    !! carbidopa-levodopa (SINEMET IR) 25-100 MG tablet Take 2 tablets by mouth 4 (four) times daily. 0600, 0900, 1200, 1500, Starting Tue 01/07/2017, Historical Med    !! carbidopa-levodopa (SINEMET IR) 25-100 MG tablet Take 0.5 tablets by mouth daily as needed (TAKES 1/2 TAB ADDT'L AS NEEDED). , Historical Med    pramipexole (MIRAPEX) 0.25 MG tablet TAKES 1 TAB BY MOUTH AT BEDTIME, Historical Med    selegiline (ELDEPRYL) 5 MG capsule TAKES 5MG  BY MOUTH TWICE DAILY, Historical Med     !! - Potential duplicate medications found. Please discuss with provider.     Allergies  Allergen Reactions  . Other Other (See Comments)    ANESTHESIA-HALLUCINATIONS  . Thorazine [Chlorpromazine] Other (See Comments)   Discharge Instructions    Diet - low sodium heart healthy    Complete by:  As directed    Increase activity slowly    Complete by:  As directed      Discharge Exam: Filed Weights   01/30/17 1858 01/31/17 0344  Weight: 52.2 kg (115 lb) 53.3 kg (117 lb 9.6 oz)   Vitals:   01/31/17 2051 02/01/17 0618  BP: (!) 135/53 136/65  Pulse: 78 84  Resp: 18 18  Temp: 98.4 F (36.9 C) 98.9 F (37.2 C)   General: Appear in no distress, no Rash; Oral Mucosa moist. Cardiovascular: S1 and S2 Present, no Murmur, no JVD Respiratory: Bilateral Air entry present and Clear to Auscultation, no Crackles, no wheezes Abdomen: Bowel Sound present, Soft and no tenderness Extremities: no Pedal edema, no calf tenderness Neurology: Grossly no focal neuro deficit.  The results of significant diagnostics from this hospitalization (including imaging, microbiology, ancillary and laboratory) are listed below for reference.    Significant Diagnostic Studies: Dg Chest 2 View  Result Date: 01/31/2017 CLINICAL DATA:  Cough, headache, and muscle rigidity. Mild  aphasia. History of Parkinson's. EXAM: CHEST  2 VIEW COMPARISON:  None. FINDINGS: Emphysematous changes in the lungs. Normal heart size and pulmonary vascularity. No focal airspace disease or consolidation. No blunting of costophrenic angles. No pneumothorax. Mediastinal contours appear intact. Postoperative changes with posterior fixation of the thoracolumbar spine. Multiple vertebral compression deformities with kyphoplasty changes. IMPRESSION: Emphysematous changes in the lungs. No evidence of active pulmonary disease. Electronically Signed   By: Burman Nieves M.D.   On: 01/31/2017 01:37   Ct Head Wo Contrast  Result Date: 01/30/2017 CLINICAL DATA:  Altered mental status. Muscle rigidity while standing. Mild headache and mild aphasia. History of Parkinson's. EXAM: CT HEAD WITHOUT CONTRAST TECHNIQUE: Contiguous axial images were obtained from the base of the skull through the vertex without intravenous contrast. COMPARISON:  None. FINDINGS: Brain: Examination is limited due to motion and streak artifact. There is suggestion of some focal low-attenuation in the left posterior temporal region. This is probably due to streak artifact but does appear somewhat asymmetric and an early area of acute infarct cannot be excluded. Mild  cerebral atrophy. Ventricles and sulci appear symmetrical otherwise. No mass effect or midline shift. No abnormal extra-axial fluid collections. Gray-white matter junctions are distinct. Basal cisterns are not effaced. No acute intracranial hemorrhage is identified. Vascular: Vascular calcifications are present. Skull: Calvarium appears intact. Sinuses/Orbits: Paranasal sinuses and mastoid air cells are clear. Other: None. IMPRESSION: Focal low-attenuation in the left posterior temporal region is probably due to streak artifact but early acute infarct is not entirely excluded. Otherwise, no evidence of acute intracranial abnormality. Mild atrophy. Electronically Signed   By: Burman Nieves M.D.   On: 01/30/2017 22:37   Time spent: 30 minutes  Signed:  Lynden Oxford  [facility] 02/01/2017, 11:25 AM

## 2017-02-21 ENCOUNTER — Encounter (HOSPITAL_COMMUNITY): Payer: Self-pay | Admitting: Emergency Medicine

## 2017-02-21 ENCOUNTER — Emergency Department (HOSPITAL_COMMUNITY)
Admission: EM | Admit: 2017-02-21 | Discharge: 2017-02-22 | Disposition: A | Discharge status: Home / Self Care | Payer: Medicare Other | Attending: Emergency Medicine | Admitting: Emergency Medicine

## 2017-02-21 DIAGNOSIS — Z79899 Other long term (current) drug therapy: Secondary | ICD-10-CM | POA: Insufficient documentation

## 2017-02-21 DIAGNOSIS — Z7901 Long term (current) use of anticoagulants: Secondary | ICD-10-CM | POA: Diagnosis not present

## 2017-02-21 DIAGNOSIS — M62838 Other muscle spasm: Secondary | ICD-10-CM

## 2017-02-21 DIAGNOSIS — G20 Parkinson's disease: Secondary | ICD-10-CM

## 2017-02-21 MED ORDER — BACLOFEN 10 MG PO TABS
10.0000 mg | ORAL_TABLET | Freq: Three times a day (TID) | ORAL | Status: DC
  Administered 2017-02-21: 10 mg via ORAL
  Filled 2017-02-21 (×2): qty 1

## 2017-02-21 MED ORDER — LORAZEPAM 2 MG/ML IJ SOLN
1.0000 mg | Freq: Once | INTRAMUSCULAR | Status: AC
  Administered 2017-02-21: 1 mg via INTRAVENOUS
  Filled 2017-02-21: qty 1

## 2017-02-21 NOTE — ED Notes (Signed)
Pt seen here for similar episode on 3/17. Paperwork brought in by EMS, located on back of stretcher, includes unfilled script sheet for baclofen & norco.

## 2017-02-21 NOTE — ED Triage Notes (Signed)
Pt BIB EMS from the [facility]. Pt has hx of advanced Parkinson's and has had episodes of muscles spasms, rigidity. Per EMS, pt took her carbadopa and levadopa at approx 745p tonight. About an hour later, pt reports new onset of spasms & rigidity in her wrists & ankle bilaterally. Also reports rigidity to neck with headache. No neuro deficits. Pt also reports some subjective SOB which resolves with decr'd tremors. Per EMS, pt lung sounds clear and 98% on RA.

## 2017-02-21 NOTE — ED Provider Notes (Signed)
[facility]-EMERGENCY DEPT Provider Note   CSN: [id number] Arrival date & time: 02/21/17  2203     History   Chief Complaint Chief Complaint  Patient presents with  . Spasms    HPI Nichole Krueger is a 81 y.o. female.  HPI  81 y.o. female with a past medical history significant for Parkinson's disease, atrial fibrillation and recurrent DVT on Eliquis who presents with rigidity and pain. Pt was admitted to the hospital on 3/16 for same. Pt reports that she started having muscle rigidity and pain all over at 7:45 pm. Symptoms have no specific precipitating factor. Pt denies any recent infections.   Pf note, pt never filled the baclofen or hydrocodone she was prescribed from her last visit. She resides at an independent living facility. Her Neurologist is at [facility].  Past Medical History:  Diagnosis Date  . Anxiety   . Atrial fibrillation (HCC)   . Macular degeneration   . Parkinson's disease (HCC)   . Pulmonary embolism [facility])     Patient Active Problem List   Diagnosis Date Noted  . Muscle rigidity 01/31/2017  . AF (paroxysmal atrial fibrillation) (HCC) 01/31/2017  . Parkinson's disease (HCC) 01/31/2017    Past Surgical History:  Procedure Laterality Date  . BACK SURGERY      OB History    No data available       Home Medications    Prior to Admission medications   Medication Sig Start Date End Date Taking? Authorizing Provider  ALPRAZolam (XANAX) 0.25 MG tablet Take 0.25 mg by mouth every morning.  12/03/16  Yes Historical Provider, MD  apixaban (ELIQUIS) 5 MG TABS tablet Take 5 mg by mouth 2 (two) times daily.   Yes Historical Provider, MD  carbidopa-levodopa (SINEMET CR) 50-200 MG tablet Take 2 tablets by mouth at bedtime.    Yes Historical Provider, MD  carbidopa-levodopa (SINEMET IR) 25-100 MG tablet Take 2 tablets by mouth 4 (four) times daily. 0600, 0900, 1200, 1500 01/07/17  Yes Historical Provider, MD  carbidopa-levodopa (SINEMET IR) 25-100 MG tablet Take 0.5  tablets by mouth daily as needed (TAKES 1/2 TAB ADDT'L AS NEEDED).    Yes Historical Provider, MD  pramipexole (MIRAPEX) 0.25 MG tablet TAKES 1 TAB BY MOUTH AT BEDTIME 01/08/17  Yes Historical Provider, MD  selegiline (ELDEPRYL) 5 MG capsule TAKES  BY MOUTH TWICE DAILY 01/03/17  Yes Historical Provider, MD  baclofen (LIORESAL) 10 MG tablet Take 1 tablet (10 mg total) by mouth 3 (three) times daily as needed for muscle spasms (leg muscle spasms). 02/01/17   Rolly Salter, MD  HYDROcodone-acetaminophen (NORCO) 5-325 MG tablet Take 1 tablet by mouth every 6 (six) hours as needed for moderate pain. 02/01/17   Rolly Salter, MD    Family History Family History  Problem Relation Age of Onset  . Stroke Mother   . Heart attack Father   . Heart attack Sister     Social History Social History  Substance Use Topics  . Smoking status: Never Smoker  . Smokeless tobacco: Never Used  . Alcohol use No     Allergies   Other and Thorazine [chlorpromazine]   Review of Systems Review of Systems  Constitutional: Positive for activity change. Negative for fever.  Respiratory: Negative for cough and shortness of breath.   Cardiovascular: Negative for chest pain.  Gastrointestinal: Negative for abdominal pain.  Musculoskeletal: Positive for arthralgias and myalgias.  Allergic/Immunologic: Negative for immunocompromised state.  All other systems reviewed and are negative.  Physical Exam Updated Vital Signs BP 130/64 (BP Location: Right Arm)   Pulse 85   Temp 98.7 F (37.1 C) (Oral)   Resp 16   SpO2 96%   Physical Exam  Constitutional: She is oriented to person, place, and time. She appears well-developed and well-nourished.  HENT:  Head: Normocephalic and atraumatic.  Eyes: EOM are normal. Pupils are equal, round, and reactive to light.  Neck: Neck supple.  Cardiovascular: Normal rate, regular rhythm and normal heart sounds.   No murmur heard. Pulmonary/Chest: Effort normal. No  respiratory distress.  Abdominal: Soft. She exhibits no distension. There is no tenderness. There is no rebound and no guarding.  Musculoskeletal: She exhibits no deformity.  Neurological: She is alert and oriented to person, place, and time.  Bilateral lower extremity shaking  Skin: Skin is warm and dry.  Nursing note and vitals reviewed.    ED Treatments / Results  Labs (all labs ordered are listed, but only abnormal results are displayed) Labs Reviewed - No data to display  EKG  EKG Interpretation None       Radiology No results found.  Procedures Procedures (including critical care time)  Medications Ordered in ED Medications  baclofen (LIORESAL) tablet 10 mg (not administered)  LORazepam (ATIVAN) injection 1 mg (1 mg Intravenous Given 02/21/17 2249)     Initial Impression / Assessment and Plan / ED Course  I have reviewed the triage vital signs and the nursing notes.  Pertinent labs & imaging results that were available during my care of the patient were reviewed by me and considered in my medical decision making (see chart for details).    Pt comes in with cc of muscle spasms. Hx of parkinsons. There is no underlying infection it appears. Pt has not taken her baclofen or her norco that was prescribed last time.  We will give ativan and baclofen now. If pt doesn't get better, Dr. Adela Lank will reassess for admission.     Final Clinical Impressions(s) / ED Diagnoses   Final diagnoses:  Parkinson's disease (HCC)  Muscle spasm    New Prescriptions New Prescriptions   No medications on file     Derwood Kaplan, MD 02/21/17 2339

## 2017-02-22 NOTE — ED Provider Notes (Signed)
81 yo F turned over to me from Dr. Shyrl Numbers, patient with spasms thought to be from her Parkinson's disease. She had been admitted previously for the same. Symptoms were significantly improved with Ativan and baclofen. Discharge home with neurology follow-up.   Melene Plan, DO 02/22/17 1610

## 2017-02-22 NOTE — Discharge Instructions (Signed)
Get your baclofen filled.  Follow up with your neurologist.

## 2017-02-22 NOTE — ED Notes (Signed)
Spoke to RN at [facility] about patient not getting previous prescriptions filled. Pt is on the independent side and manages her own medications with home health.  She will leave a message with the director of home health to follow up on this.

## 2017-02-22 NOTE — ED Notes (Signed)
[facility] called for transport.  

## 2017-05-04 ENCOUNTER — Observation Stay (HOSPITAL_COMMUNITY)
Admission: EM | Admit: 2017-05-04 | Discharge: 2017-05-06 | Disposition: A | Discharge status: Home / Self Care | Payer: Medicare Other | Attending: Internal Medicine | Admitting: Internal Medicine

## 2017-05-04 ENCOUNTER — Encounter (HOSPITAL_COMMUNITY): Payer: Self-pay

## 2017-05-04 ENCOUNTER — Emergency Department (HOSPITAL_COMMUNITY): Payer: Medicare Other

## 2017-05-04 DIAGNOSIS — S0101XA Laceration without foreign body of scalp, initial encounter: Secondary | ICD-10-CM | POA: Diagnosis present

## 2017-05-04 DIAGNOSIS — I480 Paroxysmal atrial fibrillation: Secondary | ICD-10-CM | POA: Diagnosis not present

## 2017-05-04 DIAGNOSIS — W19XXXA Unspecified fall, initial encounter: Secondary | ICD-10-CM | POA: Insufficient documentation

## 2017-05-04 DIAGNOSIS — Y92099 Unspecified place in other non-institutional residence as the place of occurrence of the external cause: Secondary | ICD-10-CM | POA: Diagnosis not present

## 2017-05-04 DIAGNOSIS — Z86711 Personal history of pulmonary embolism: Secondary | ICD-10-CM | POA: Diagnosis not present

## 2017-05-04 DIAGNOSIS — I482 Chronic atrial fibrillation: Secondary | ICD-10-CM | POA: Diagnosis not present

## 2017-05-04 DIAGNOSIS — F419 Anxiety disorder, unspecified: Secondary | ICD-10-CM | POA: Diagnosis not present

## 2017-05-04 DIAGNOSIS — G20 Parkinson's disease: Secondary | ICD-10-CM | POA: Diagnosis not present

## 2017-05-04 DIAGNOSIS — Z7902 Long term (current) use of antithrombotics/antiplatelets: Secondary | ICD-10-CM | POA: Insufficient documentation

## 2017-05-04 DIAGNOSIS — Z79899 Other long term (current) drug therapy: Secondary | ICD-10-CM | POA: Insufficient documentation

## 2017-05-04 DIAGNOSIS — R55 Syncope and collapse: Secondary | ICD-10-CM | POA: Insufficient documentation

## 2017-05-04 DIAGNOSIS — I951 Orthostatic hypotension: Secondary | ICD-10-CM

## 2017-05-04 DIAGNOSIS — G20A1 Parkinson's disease without dyskinesia, without mention of fluctuations: Secondary | ICD-10-CM | POA: Diagnosis present

## 2017-05-04 LAB — URINALYSIS, ROUTINE W REFLEX MICROSCOPIC
BILIRUBIN URINE: NEGATIVE
GLUCOSE, UA: NEGATIVE mg/dL
Hgb urine dipstick: NEGATIVE
KETONES UR: NEGATIVE mg/dL
NITRITE: NEGATIVE
PH: 7 (ref 5.0–8.0)
PROTEIN: NEGATIVE mg/dL
Specific Gravity, Urine: 1.013 (ref 1.005–1.030)
WBC, UA: NONE SEEN WBC/hpf (ref 0–5)

## 2017-05-04 LAB — CBC WITH DIFFERENTIAL/PLATELET
BASOS ABS: 0 10*3/uL (ref 0.0–0.1)
BASOS PCT: 0 %
Eosinophils Absolute: 0 10*3/uL (ref 0.0–0.7)
Eosinophils Relative: 0 %
HEMATOCRIT: 32.9 % — AB (ref 36.0–46.0)
Hemoglobin: 10.7 g/dL — ABNORMAL LOW (ref 12.0–15.0)
LYMPHS PCT: 19 %
Lymphs Abs: 1 10*3/uL (ref 0.7–4.0)
MCH: 31.6 pg (ref 26.0–34.0)
MCHC: 32.5 g/dL (ref 30.0–36.0)
MCV: 97.1 fL (ref 78.0–100.0)
MONO ABS: 0.4 10*3/uL (ref 0.1–1.0)
Monocytes Relative: 7 %
NEUTROS ABS: 4 10*3/uL (ref 1.7–7.7)
Neutrophils Relative %: 74 %
PLATELETS: 208 10*3/uL (ref 150–400)
RBC: 3.39 MIL/uL — AB (ref 3.87–5.11)
RDW: 14 % (ref 11.5–15.5)
WBC: 5.4 10*3/uL (ref 4.0–10.5)

## 2017-05-04 LAB — COMPREHENSIVE METABOLIC PANEL
ALBUMIN: 3.6 g/dL (ref 3.5–5.0)
ALK PHOS: 85 U/L (ref 38–126)
ALT: 12 U/L — ABNORMAL LOW (ref 14–54)
ANION GAP: 8 (ref 5–15)
AST: 21 U/L (ref 15–41)
BILIRUBIN TOTAL: 0.7 mg/dL (ref 0.3–1.2)
BUN: 16 mg/dL (ref 6–20)
CALCIUM: 8.9 mg/dL (ref 8.9–10.3)
CO2: 25 mmol/L (ref 22–32)
Chloride: 106 mmol/L (ref 101–111)
Creatinine, Ser: 0.6 mg/dL (ref 0.44–1.00)
GFR calc Af Amer: >60 mL/min (ref >60)
GLUCOSE: 114 mg/dL — AB (ref 65–99)
POTASSIUM: 3.9 mmol/L (ref 3.5–5.1)
Sodium: 139 mmol/L (ref 135–145)
TOTAL PROTEIN: 6.3 g/dL — AB (ref 6.5–8.1)

## 2017-05-04 LAB — TROPONIN I: Troponin I: <0.03 ng/mL (ref <0.03)

## 2017-05-04 LAB — TSH: TSH: 0.9 u[IU]/mL (ref 0.350–4.500)

## 2017-05-04 MED ORDER — BACLOFEN 10 MG PO TABS
10.0000 mg | ORAL_TABLET | Freq: Three times a day (TID) | ORAL | Status: DC | PRN
  Administered 2017-05-05 – 2017-05-06 (×2): 10 mg via ORAL
  Filled 2017-05-04 (×3): qty 1

## 2017-05-04 MED ORDER — CARBIDOPA-LEVODOPA 25-100 MG PO TABS: 0.5000 | ORAL_TABLET | Freq: Every day | ORAL | Status: DC | PRN

## 2017-05-04 MED ORDER — SELEGILINE HCL 5 MG PO CAPS
5.0000 mg | ORAL_CAPSULE | Freq: Two times a day (BID) | ORAL | Status: DC
  Administered 2017-05-04 – 2017-05-06 (×4): 5 mg via ORAL
  Filled 2017-05-04 (×5): qty 1

## 2017-05-04 MED ORDER — CARBIDOPA-LEVODOPA ER 50-200 MG PO TBCR
2.0000 | EXTENDED_RELEASE_TABLET | Freq: Every day | ORAL | Status: DC
  Administered 2017-05-04 – 2017-05-05 (×2): 2 via ORAL
  Filled 2017-05-04 (×2): qty 2

## 2017-05-04 MED ORDER — ACETAMINOPHEN 325 MG PO TABS
650.0000 mg | ORAL_TABLET | Freq: Four times a day (QID) | ORAL | Status: DC | PRN
  Administered 2017-05-04 – 2017-05-06 (×4): 650 mg via ORAL
  Filled 2017-05-04 (×4): qty 2

## 2017-05-04 MED ORDER — CARBIDOPA-LEVODOPA 25-100 MG PO TABS
2.0000 | ORAL_TABLET | Freq: Four times a day (QID) | ORAL | Status: DC
  Administered 2017-05-04 – 2017-05-06 (×9): 2 via ORAL
  Filled 2017-05-04 (×11): qty 2

## 2017-05-04 MED ORDER — ALPRAZOLAM 0.25 MG PO TABS
0.2500 mg | ORAL_TABLET | Freq: Every evening | ORAL | Status: DC | PRN
  Administered 2017-05-05: 0.25 mg via ORAL
  Filled 2017-05-04: qty 1

## 2017-05-04 MED ORDER — TETANUS-DIPHTH-ACELL PERTUSSIS 5-2.5-18.5 LF-MCG/0.5 IM SUSP
0.5000 mL | Freq: Once | INTRAMUSCULAR | Status: AC
  Administered 2017-05-04: 0.5 mL via INTRAMUSCULAR
  Filled 2017-05-04: qty 0.5

## 2017-05-04 MED ORDER — LIDOCAINE-EPINEPHRINE-TETRACAINE (LET) SOLUTION
3.0000 mL | Freq: Once | NASAL | Status: AC
  Administered 2017-05-04: 0.5 mL via TOPICAL
  Filled 2017-05-04: qty 3

## 2017-05-04 MED ORDER — SODIUM CHLORIDE 0.9% FLUSH
3.0000 mL | Freq: Two times a day (BID) | INTRAVENOUS | Status: DC
  Administered 2017-05-04 – 2017-05-06 (×5): 3 mL via INTRAVENOUS

## 2017-05-04 MED ORDER — APIXABAN 5 MG PO TABS
5.0000 mg | ORAL_TABLET | Freq: Two times a day (BID) | ORAL | Status: DC
  Administered 2017-05-04 – 2017-05-06 (×5): 5 mg via ORAL
  Filled 2017-05-04 (×5): qty 1

## 2017-05-04 MED ORDER — PRAMIPEXOLE DIHYDROCHLORIDE 0.25 MG PO TABS
0.2500 mg | ORAL_TABLET | Freq: Every day | ORAL | Status: DC
  Administered 2017-05-04 – 2017-05-05 (×2): 0.25 mg via ORAL
  Filled 2017-05-04 (×2): qty 1

## 2017-05-04 MED ORDER — BLISTEX MEDICATED EX OINT
TOPICAL_OINTMENT | CUTANEOUS | Status: DC | PRN
  Administered 2017-05-04: 18:00:00 via TOPICAL
  Filled 2017-05-04: qty 6.3

## 2017-05-04 NOTE — Progress Notes (Signed)
Patient arrived on unit from ED, VSS.  Patient is drowsy but can answer all nurses questions except what happened this morning.  Cardiac monitoring applied as ordered.

## 2017-05-04 NOTE — ED Triage Notes (Signed)
Patient from a skilled nursing home for a fall.  Unsure when she fell as it was unwitnessed.  Has laceration to posterior left skull.  Unknown LOC.  Patient has restless leg syndrome. Vitals are stable.

## 2017-05-04 NOTE — ED Provider Notes (Signed)
  Physical Exam  BP 140/66   Pulse (!) 104   Temp 98.5 F (36.9 C) (Oral)   Resp 17   SpO2 97%   Physical Exam  ED Course  .Marland Kitchen.Laceration Repair Date/Time: 05/04/2017 12:47 PM Performed by: Wynetta EmeryPISCIOTTA, Lynnann Knudsen Authorized by: Wynetta EmeryPISCIOTTA, Taleia Sadowski   Laceration details:    Location:  Scalp   Length (cm):  3 Repair type:    Repair type:  Simple Pre-procedure details:    Preparation:  Patient was prepped and draped in usual sterile fashion Treatment:    Irrigation solution:  Sterile saline   Irrigation method:  Pressure wash   Visualized foreign bodies/material removed: no   Skin repair:    Repair method:  Staples   Number of staples:  2 Approximation:    Approximation:  Close           Wynetta Emeryisciotta, Hideko Esselman, PA-C 05/04/17 1248    Rolan BuccoBelfi, Melanie, MD 05/04/17 1255

## 2017-05-04 NOTE — H&P (Addendum)
[facility] History and Physical  Nichole Nestleancy Smolinsky ZOX:[id number] DOB: 07/12/1936 DOA: 05/04/2017  Referring physician:  PCP: Merlene LaughterStoneking, Hal, MD  Specialists:   Chief Complaint: fall, syncope   HPI: Nichole Krueger is a 81 y.o. female with PMH of a fib, dvt on eliques, parkinson's disease presented after a fall. [facility] with her sisters. Patient is alert, oriented but could not remember the fall. I called and discussed with her sisters at ALF. She reports no recent history of recurrent falls. - She was found by a staff member on the floor in pool of blood due to head/trauma laceration injury. She states that she does not remember having a fall or syncope. She was confused when she woke up on the floor. No tongue biting or urinary or fecal incontinence, no focal weakness or paresthesias. She reports walking with a walker and gait ataxia due to parkinson's disease. She denies any chest pains, no shortness of breath, no nausea, vomiting or diarrhea, no fevers, no dysuria.   Review of Systems: The patient denies anorexia, fever, weight loss,, vision loss, decreased hearing, hoarseness, chest pain, syncope, dyspnea on exertion, peripheral edema, balance deficits, hemoptysis, abdominal pain, melena, hematochezia, severe indigestion/heartburn, hematuria, incontinence, genital sores, muscle weakness, suspicious skin lesions, transient blindness, difficulty walking, depression, unusual weight change, abnormal bleeding, enlarged lymph nodes, angioedema, and breast masses.    Past Medical History:  Diagnosis Date  . Anxiety   . Atrial fibrillation (HCC)   . Macular degeneration   . Parkinson's disease (HCC)   . Pulmonary embolism [facility](HCC)    Past Surgical History:  Procedure Laterality Date  . BACK SURGERY     Social History:  reports that she has never smoked. She has never used smokeless tobacco. She reports that she does not drink alcohol or use  drugs. Home;  where does patient live--home, ALF, SNF? and with whom if at home? Yes;  Can patient participate in ADLs?  Allergies  Allergen Reactions  . Other Other (See Comments)    ANESTHESIA-HALLUCINATIONS  . Thorazine [Chlorpromazine] Other (See Comments)    Family History  Problem Relation Age of Onset  . Stroke Mother   . Heart attack Father   . Heart attack Sister     (be sure to complete)  Prior to Admission medications   Medication Sig Start Date End Date Taking? Authorizing Provider  ALPRAZolam (XANAX) 0.25 MG tablet Take 0.25 mg by mouth every morning.  12/03/16   [provider]  apixaban (ELIQUIS) 5 MG TABS tablet Take 5 mg by mouth 2 (two) times daily.    [provider]  baclofen (LIORESAL) 10 MG tablet Take 1 tablet (10 mg total) by mouth 3 (three) times daily as needed for muscle spasms (leg muscle spasms). 02/01/17   Rolly SalterPatel, Pranav M, MD  carbidopa-levodopa (SINEMET CR) 50-200 MG tablet Take 2 tablets by mouth at bedtime.     [provider]  carbidopa-levodopa (SINEMET IR) 25-100 MG tablet Take 2 tablets by mouth 4 (four) times daily. 0600, 0900, 1200, 1500 01/07/17   [provider]  carbidopa-levodopa (SINEMET IR) 25-100 MG tablet Take 0.5 tablets by mouth daily as needed (TAKES 1/2 TAB ADDT'L AS NEEDED).     [provider]  HYDROcodone-acetaminophen (NORCO) 5-325 MG tablet Take 1 tablet by mouth every 6 (six) hours as needed for moderate pain. 02/01/17   Rolly SalterPatel, Pranav M, MD  pramipexole (MIRAPEX) 0.25 MG tablet TAKES 1 TAB BY MOUTH AT  BEDTIME 01/08/17   [provider]  selegiline (ELDEPRYL) 5 MG capsule TAKES 5MG  BY MOUTH TWICE DAILY 01/03/17   [provider]   Physical Exam: Vitals:   05/04/17 1030 05/04/17 1100  BP: (!) 158/72 140/66  Pulse: (!) 103 (!) 104  Resp: (!) 22 17  Temp:       General:  Alert, oriented   Eyes: eom-I, perrla   ENT: no oral ulcer  Neck: supple   Cardiovascular:  s1,s2 rrr  Respiratory: CTA BL:  Abdomen: soft, nt, nd   Skin: scalp laceration   Musculoskeletal: no leg edema   Psychiatric: denies hallucinations   Neurologic: CN 2-12 intact. Motor strength and sensory: preserved symmetric. Mild rigidity   Labs on Admission:  Basic Metabolic Panel:  Recent Labs Lab 05/04/17 0943  NA 139  K 3.9  CL 106  CO2 25  GLUCOSE 114*  BUN 16  CREATININE 0.60  CALCIUM 8.9   Liver Function Tests:  Recent Labs Lab 05/04/17 0943  AST 21  ALT 12*  ALKPHOS 85  BILITOT 0.7  PROT 6.3*  ALBUMIN 3.6   No results for input(s): LIPASE, AMYLASE in the last 168 hours. No results for input(s): AMMONIA in the last 168 hours. CBC:  Recent Labs Lab 05/04/17 0943  WBC 5.4  NEUTROABS 4.0  HGB 10.7*  HCT 32.9*  MCV 97.1  PLT 208   Cardiac Enzymes: No results for input(s): CKTOTAL, CKMB, CKMBINDEX, TROPONINI in the last 168 hours.  BNP (last 3 results) No results for input(s): BNP in the last 8760 hours.  ProBNP (last 3 results) No results for input(s): PROBNP in the last 8760 hours.  CBG: No results for input(s): GLUCAP in the last 168 hours.  Radiological Exams on Admission: Ct Head Wo Contrast  Result Date: 05/04/2017 CLINICAL DATA:  Fall. EXAM: CT HEAD WITHOUT CONTRAST CT CERVICAL SPINE WITHOUT CONTRAST TECHNIQUE: Multidetector CT imaging of the head and cervical spine was performed following the standard protocol without intravenous contrast. Multiplanar CT image reconstructions of the cervical spine were also generated. COMPARISON:  01/30/2017 FINDINGS: CT HEAD FINDINGS Brain: No evidence of acute infarction, hemorrhage, hydrocephalus, extra-axial collection or mass lesion/mass effect. Vascular: No hyperdense vessel or unexpected calcification. Skull: Normal. Negative for fracture or focal lesion. Sinuses/Orbits: No acute finding. Other: None. CT CERVICAL SPINE FINDINGS Image detail degraded by motion artifact. Alignment: Normal. Skull  base and vertebrae: No acute fracture. No primary bone lesion or focal pathologic process. Soft tissues and spinal canal: No prevertebral fluid or swelling. No visible canal hematoma. Disc levels: Multi level disc space narrowing and ventral endplate spurring noted. Most advanced at C4-5, C5-6, and C6-7. Upper chest: Negative. Other: None IMPRESSION: 1. No acute intracranial abnormalities. 2. Cervical spine detail degraded by motion artifact. No fracture or dislocation identified 3. Multilevel degenerative disc disease with cervical spine Electronically Signed   By: Signa Kell M.D.   On: 05/04/2017 10:13   Ct Cervical Spine Wo Contrast  Result Date: 05/04/2017 CLINICAL DATA:  Fall. EXAM: CT HEAD WITHOUT CONTRAST CT CERVICAL SPINE WITHOUT CONTRAST TECHNIQUE: Multidetector CT imaging of the head and cervical spine was performed following the standard protocol without intravenous contrast. Multiplanar CT image reconstructions of the cervical spine were also generated. COMPARISON:  01/30/2017 FINDINGS: CT HEAD FINDINGS Brain: No evidence of acute infarction, hemorrhage, hydrocephalus, extra-axial collection or mass lesion/mass effect. Vascular: No hyperdense vessel or unexpected calcification. Skull: Normal. Negative for fracture or focal lesion. Sinuses/Orbits: No acute finding. Other:  None. CT CERVICAL SPINE FINDINGS Image detail degraded by motion artifact. Alignment: Normal. Skull base and vertebrae: No acute fracture. No primary bone lesion or focal pathologic process. Soft tissues and spinal canal: No prevertebral fluid or swelling. No visible canal hematoma. Disc levels: Multi level disc space narrowing and ventral endplate spurring noted. Most advanced at C4-5, C5-6, and C6-7. Upper chest: Negative. Other: None IMPRESSION: 1. No acute intracranial abnormalities. 2. Cervical spine detail degraded by motion artifact. No fracture or dislocation identified 3. Multilevel degenerative disc disease with  cervical spine Electronically Signed   By: Signa Kell M.D.   On: 05/04/2017 10:13    EKG: Independently reviewed.   Assessment/Plan Active Problems:   Syncope  81 y.o. female with PMH of a fib, dvt on eliques, parkinson's disease presented after a fall. R/o syncope vs seizure   Fall, head trauma/scalp laceration. Unclear etiology: syncope vs seizure. Neuro exam is non focal. Ct head: No acute intracranial abnormalities. Ecg: appears sinus rhythm. No s/ sof systemic infection. UA: unremarkable, will check CXR -will monitor on tele r/o arrhythmias, check echo, trop. Will obtain eeg r/o seizure, ? antiparkinson's med related syncope..consulted neurology eval. No prodrome to suspect orthostatics, will check orthostatics VS. PT/OT eval  A fib, dvt on eliques. Monitor on tele. HR is stable, not on BB  Neurology.  if consultant consulted, please document name and whether formally or informally consulted  Addendum: d/w E. Lindzen. Who recommended to cont antiparkinson meds for now. He thought that seizure is less likely.  Code Status: full (must indicate code status--if unknown or must be presumed, indicate so) Family Communication: d/w patient, her family (indicate person spoken with, if applicable, with phone number if by telephone) Disposition Plan: home 24-48 hrs  (indicate anticipated LOS)  Time spent: >45 minutes   Esperanza Sheets [facility] Pager [phone]  If 7PM-7AM, please contact night-coverage [email or web address] Password [facility] 05/04/2017, 12:55 PM

## 2017-05-04 NOTE — ED Provider Notes (Signed)
[facility]-EMERGENCY DEPT Provider Note   CSN: [id number] Arrival date & time: 05/04/17  0846     History   Chief Complaint Chief Complaint  Patient presents with  . Fall    HPI Nichole Krueger is a 81 y.o. female.  Patient is a 81 year old female with a history of Parkinson's and A. fib, on Eliquis who presents after a fall. [facility].  She was brought in by EMS after staff found her on the floor in a pool of blood. She has a laceration to her scalp. She doesn't have any recollection of the fall. She doesn't know that she fell. She doesn't complain of any pain. She denies any chest pain or shortness of breath. She denies any recent illnesses. She denies any history of seizures. She does have Parkinson's and says that she's having some muscle spasms related to the Parkinson's. She lives in the independent area of the retirement home.      Past Medical History:  Diagnosis Date  . Anxiety   . Atrial fibrillation (HCC)   . Macular degeneration   . Parkinson's disease (HCC)   . Pulmonary embolism [facility](HCC)     Patient Active Problem List   Diagnosis Date Noted  . Syncope 05/04/2017  . Muscle rigidity 01/31/2017  . AF (paroxysmal atrial fibrillation) (HCC) 01/31/2017  . Parkinson's disease (HCC) 01/31/2017    Past Surgical History:  Procedure Laterality Date  . BACK SURGERY      OB History    No data available       Home Medications    Prior to Admission medications   Medication Sig Start Date End Date Taking? Authorizing Provider  ALPRAZolam (XANAX) 0.25 MG tablet Take 0.25 mg by mouth every morning.  12/03/16   [provider]  apixaban (ELIQUIS) 5 MG TABS tablet Take 5 mg by mouth 2 (two) times daily.    [provider]  baclofen (LIORESAL) 10 MG tablet Take 1 tablet (10 mg total) by mouth 3 (three) times daily as needed for muscle spasms (leg muscle spasms). 02/01/17   Rolly SalterPatel, Pranav M, MD  carbidopa-levodopa  (SINEMET CR) 50-200 MG tablet Take 2 tablets by mouth at bedtime.     [provider]  carbidopa-levodopa (SINEMET IR) 25-100 MG tablet Take 2 tablets by mouth 4 (four) times daily. 0600, 0900, 1200, 1500 01/07/17   [provider]  carbidopa-levodopa (SINEMET IR) 25-100 MG tablet Take 0.5 tablets by mouth daily as needed (TAKES 1/2 TAB ADDT'L AS NEEDED).     [provider]  HYDROcodone-acetaminophen (NORCO) 5-325 MG tablet Take 1 tablet by mouth every 6 (six) hours as needed for moderate pain. 02/01/17   Rolly SalterPatel, Pranav M, MD  pramipexole (MIRAPEX) 0.25 MG tablet TAKES 1 TAB BY MOUTH AT BEDTIME 01/08/17   [provider]  selegiline (ELDEPRYL) 5 MG capsule TAKES 5MG  BY MOUTH TWICE DAILY 01/03/17   [provider]    Family History Family History  Problem Relation Age of Onset  . Stroke Mother   . Heart attack Father   . Heart attack Sister     Social History Social History  Substance Use Topics  . Smoking status: Never Smoker  . Smokeless tobacco: Never Used  . Alcohol use No     Allergies   Other and Thorazine [chlorpromazine]   Review of Systems Review of Systems  Constitutional: Negative for chills, diaphoresis, fatigue and fever.  HENT: Negative for congestion, rhinorrhea and sneezing.  Eyes: Negative.   Respiratory: Negative for cough, chest tightness and shortness of breath.   Cardiovascular: Negative for chest pain and leg swelling.  Gastrointestinal: Negative for abdominal pain, blood in stool, diarrhea, nausea and vomiting.  Genitourinary: Negative for difficulty urinating, flank pain, frequency and hematuria.  Musculoskeletal: Negative for arthralgias and back pain.  Skin: Positive for wound. Negative for rash.  Neurological: Negative for dizziness, speech difficulty, weakness, numbness and headaches.     Physical Exam Updated Vital Signs BP 140/66   Pulse (!) 104   Temp 98.5 F (36.9 C) (Oral)   Resp 17   SpO2 97%    Physical Exam  Constitutional: She is oriented to person, place, and time. She appears well-developed and well-nourished.  HENT:  Head: Normocephalic.  1.5 cm laceration to the parietal scalp  Eyes: Pupils are equal, round, and reactive to light.  Neck: Normal range of motion. Neck supple.  No pain along the cervical thoracic or lumbosacral spine  Cardiovascular: Normal rate, regular rhythm and normal heart sounds.   Pulmonary/Chest: Effort normal and breath sounds normal. No respiratory distress. She has no wheezes. She has no rales. She exhibits no tenderness.  Abdominal: Soft. Bowel sounds are normal. There is no tenderness. There is no rebound and no guarding.  Musculoskeletal: Normal range of motion. She exhibits no edema.  No pain on palpation or range of motion of the extremities, including the hips  Lymphadenopathy:    She has no cervical adenopathy.  Neurological: She is alert and oriented to person, place, and time.  Moves all extremities symmetrically without focal deficits  Skin: Skin is warm and dry. No rash noted.  Psychiatric: She has a normal mood and affect.     ED Treatments / Results  Labs (all labs ordered are listed, but only abnormal results are displayed) Labs Reviewed  COMPREHENSIVE METABOLIC PANEL - Abnormal; Notable for the following:       Result Value   Glucose, Bld 114 (*)    Total Protein 6.3 (*)    ALT 12 (*)    All other components within normal limits  CBC WITH DIFFERENTIAL/PLATELET - Abnormal; Notable for the following:    RBC 3.39 (*)    Hemoglobin 10.7 (*)    HCT 32.9 (*)    All other components within normal limits  URINALYSIS, ROUTINE W REFLEX MICROSCOPIC - Abnormal; Notable for the following:    Leukocytes, UA TRACE (*)    Bacteria, UA RARE (*)    Squamous Epithelial / LPF 0-5 (*)    All other components within normal limits    EKG  EKG Interpretation  Date/Time:  Sunday May 04 2017 09:40:27 EDT Ventricular Rate:  96 PR  Interval:    QRS Duration: 77 QT Interval:  346 QTC Calculation: 438 R Axis:   43 Text Interpretation:  Sinus rhythm Minimal ST depression, anterolateral leads since last tracing no significant change Confirmed by Rolan Bucco [phone]) on 05/04/2017 9:44:32 AM Also confirmed by Rolan Bucco [id number]), editor Misty Stanley [phone])  on 05/04/2017 10:14:28 AM       Radiology Ct Head Wo Contrast  Result Date: 05/04/2017 CLINICAL DATA:  Fall. EXAM: CT HEAD WITHOUT CONTRAST CT CERVICAL SPINE WITHOUT CONTRAST TECHNIQUE: Multidetector CT imaging of the head and cervical spine was performed following the standard protocol without intravenous contrast. Multiplanar CT image reconstructions of the cervical spine were also generated. COMPARISON:  01/30/2017 FINDINGS: CT HEAD FINDINGS Brain: No evidence of acute infarction, hemorrhage, hydrocephalus, extra-axial  collection or mass lesion/mass effect. Vascular: No hyperdense vessel or unexpected calcification. Skull: Normal. Negative for fracture or focal lesion. Sinuses/Orbits: No acute finding. Other: None. CT CERVICAL SPINE FINDINGS Image detail degraded by motion artifact. Alignment: Normal. Skull base and vertebrae: No acute fracture. No primary bone lesion or focal pathologic process. Soft tissues and spinal canal: No prevertebral fluid or swelling. No visible canal hematoma. Disc levels: Multi level disc space narrowing and ventral endplate spurring noted. Most advanced at C4-5, C5-6, and C6-7. Upper chest: Negative. Other: None IMPRESSION: 1. No acute intracranial abnormalities. 2. Cervical spine detail degraded by motion artifact. No fracture or dislocation identified 3. Multilevel degenerative disc disease with cervical spine Electronically Signed   By: Signa Kell M.D.   On: 05/04/2017 10:13   Ct Cervical Spine Wo Contrast  Result Date: 05/04/2017 CLINICAL DATA:  Fall. EXAM: CT HEAD WITHOUT CONTRAST CT CERVICAL SPINE WITHOUT CONTRAST  TECHNIQUE: Multidetector CT imaging of the head and cervical spine was performed following the standard protocol without intravenous contrast. Multiplanar CT image reconstructions of the cervical spine were also generated. COMPARISON:  01/30/2017 FINDINGS: CT HEAD FINDINGS Brain: No evidence of acute infarction, hemorrhage, hydrocephalus, extra-axial collection or mass lesion/mass effect. Vascular: No hyperdense vessel or unexpected calcification. Skull: Normal. Negative for fracture or focal lesion. Sinuses/Orbits: No acute finding. Other: None. CT CERVICAL SPINE FINDINGS Image detail degraded by motion artifact. Alignment: Normal. Skull base and vertebrae: No acute fracture. No primary bone lesion or focal pathologic process. Soft tissues and spinal canal: No prevertebral fluid or swelling. No visible canal hematoma. Disc levels: Multi level disc space narrowing and ventral endplate spurring noted. Most advanced at C4-5, C5-6, and C6-7. Upper chest: Negative. Other: None IMPRESSION: 1. No acute intracranial abnormalities. 2. Cervical spine detail degraded by motion artifact. No fracture or dislocation identified 3. Multilevel degenerative disc disease with cervical spine Electronically Signed   By: Signa Kell M.D.   On: 05/04/2017 10:13    Procedures Procedures (including critical care time)  Medications Ordered in ED Medications  lidocaine-EPINEPHrine-tetracaine (LET) solution (not administered)     Initial Impression / Assessment and Plan / ED Course  I have reviewed the triage vital signs and the nursing notes.  Pertinent labs & imaging results that were available during my care of the patient were reviewed by me and considered in my medical decision making (see chart for details).     Patient presents after a fall. She has no recollection of the event. I don't know if she possibly had a seizure versus syncopal episode. She has a laceration to her scalp and no other apparent injuries. Her  head CT is negative. There is no other evident injuries. I as well as the nurse had attempted to contact family and this is been unsuccessful. I attempted multiple times to contact the nursing facility and this was also unsuccessful. I don't feel that she can be discharged home without knowledge that this is a typical type event for her. She seems to be oriented but has no recollection of the event. Given this, I have consulted the hospitalist who will admit the patient for observation.  Final Clinical Impressions(s) / ED Diagnoses   Final diagnoses:  Fall, initial encounter  Syncope, unspecified syncope type  Scalp laceration, initial encounter    New Prescriptions New Prescriptions   No medications on file     Rolan Bucco, MD 05/04/17 1234

## 2017-05-04 NOTE — ED Notes (Signed)
Patient taken to CT.

## 2017-05-04 NOTE — ED Notes (Signed)
Patient is stable and ready to be transport to the floor at this time.  Report was called to 3E RN.  Belongings taken with the patient to the floor.   

## 2017-05-04 NOTE — Evaluation (Signed)
Physical Therapy Evaluation Patient Details Name: Nichole Krueger MRN: [id number] DOB: 1936-01-17 Today's Date: 05/04/2017   History of Present Illness  Patient is a 81 y/o female who presents s/p fall. rule out syncope vs seizure. PMH includes PD, A-fib, DVT, PE, macular degeneration. Pt with head trauma/scalp laceration stapled in ED.   Clinical Impression  Patient presents with lethargy and impaired mobility s/p above. Tolerated taking a few steps to chair with Min A for balance/safety. Pt keeping eyes closed for much of session, and reports feeling sleepy. Pt from [facility] and uses RW for mobility. Orthostatic vitals negative. See flow sheet. Anticipate once arousal improves, so will mobility. Will follow acutely to maximize independence and mobility prior to return home.     Follow Up Recommendations Home health PT;Supervision for mobility/OOB;Supervision/Assistance - 24 hour    Equipment Recommendations  None recommended by PT    Recommendations for Other Services OT consult     Precautions / Restrictions Precautions Precautions: Fall Restrictions Weight Bearing Restrictions: No      Mobility  Bed Mobility Overal bed mobility: Needs Assistance Bed Mobility: Supine to Sit     Supine to sit: HOB elevated;Mod assist     General bed mobility comments: Assist to help guide LEs to EOB and to elevate trunk. No dizziness. Orthostatics taken and negative. See vitals flowsheet.  Transfers Overall transfer level: Needs assistance Equipment used: Rolling walker (2 wheeled) Transfers: Sit to/from Stand Sit to Stand: Min assist         General transfer comment: Assist to power to standing, slow to initiate movement. Narrow BoS.  Ambulation/Gait Ambulation/Gait assistance: Min assist Ambulation Distance (Feet): 4 Feet Assistive device: Rolling walker (2 wheeled) Gait Pattern/deviations: Step-to pattern;Trunk flexed;Narrow base of support Gait velocity: decreased    General Gait Details: Able to take a few steps to get to chair with Min A for balance/safety and to guide RW.   Stairs            Wheelchair Mobility    Modified Rankin (Stroke Patients Only)       Balance Overall balance assessment: Needs assistance Sitting-balance support: Feet supported;No upper extremity supported Sitting balance-Leahy Scale: Fair     Standing balance support: During functional activity;Bilateral upper extremity supported Standing balance-Leahy Scale: Poor Standing balance comment: Reliant on BUEs for support in standing.                             Pertinent Vitals/Pain Pain Assessment: No/denies pain    Home Living Family/patient expects to be discharged to::  [facility])                      Prior Function Level of Independence: Independent with assistive device(s)         Comments: Ambulates with RW. Able to walk to dining room for meals. Independent with IADLs. Cooks sometimes.      Hand Dominance        Extremity/Trunk Assessment   Upper Extremity Assessment Upper Extremity Assessment: Defer to OT evaluation    Lower Extremity Assessment Lower Extremity Assessment: Generalized weakness       Communication   Communication: No difficulties  Cognition Arousal/Alertness: Lethargic Behavior During Therapy: WFL for tasks assessed/performed Overall Cognitive Status: No family/caregiver present to determine baseline cognitive functioning Area of Impairment: Memory;Problem solving  Memory: Decreased short-term memory (does not recall falling, "they told me I fell, I did not even know where it happened.")       Problem Solving: Slow processing;Decreased initiation;Requires verbal cues General Comments: Pt keeps eyes closed for most of session, able to open on command. Reports feeling tired. Able to converse and answer questions appropriately.      General Comments General  comments (skin integrity, edema, etc.): VSS.    Exercises     Assessment/Plan    PT Assessment Patient needs continued PT services  PT Problem List Decreased strength;Decreased mobility;Decreased balance;Decreased cognition;Decreased activity tolerance       PT Treatment Interventions Therapeutic activities;Gait training;Therapeutic exercise;Patient/family education;Balance training;Functional mobility training    PT Goals (Current goals can be found in the Care Plan section)  Acute Rehab PT Goals Patient Stated Goal: none stated PT Goal Formulation: With patient Time For Goal Achievement: 05/18/17 Potential to Achieve Goals: Fair    Frequency Min 3X/week   Barriers to discharge Decreased caregiver support      Co-evaluation               AM-PAC PT "6 Clicks" Daily Activity  Outcome Measure Difficulty turning over in bed (including adjusting bedclothes, sheets and blankets)?: None Difficulty moving from lying on back to sitting on the side of the bed? : Total Difficulty sitting down on and standing up from a chair with arms (e.g., wheelchair, bedside commode, etc,.)?: Total Help needed moving to and from a bed to chair (including a wheelchair)?: A Little Help needed walking in hospital room?: A Little Help needed climbing 3-5 steps with a railing? : A Lot 6 Click Score: 14    End of Session Equipment Utilized During Treatment: Gait belt Activity Tolerance: Patient limited by lethargy;Patient tolerated treatment well Patient left: in chair;with call bell/phone within reach;with chair alarm set Nurse Communication: Mobility status PT Visit Diagnosis: Unsteadiness on feet (R26.81);Other abnormalities of gait and mobility (R26.89)    Time: [id number] PT Time Calculation (min) (ACUTE ONLY): 25 min   Charges:   PT Evaluation $PT Eval Low Complexity: 1 Procedure PT Treatments $Therapeutic Activity: 8-22 mins   PT G Codes:   PT G-Codes **NOT FOR INPATIENT  CLASS** Functional Assessment Tool Used: Clinical judgement Functional Limitation: Mobility: Walking and moving around Mobility: Walking and Moving Around Current Status ([id number]([id number]): At least 20 percent but less than 40 percent impaired, limited or restricted Mobility: Walking and Moving Around Goal Status [phone](G8979): At least 1 percent but less than 20 percent impaired, limited or restricted    [place], [place], [place] [phone]    Marcy PanningShauna A Maylene Crocker 05/04/2017, 3:42 PM

## 2017-05-04 NOTE — Progress Notes (Signed)
Patients mentation greatly improved after being on unit for a couple of hours, sat up in chair ate lunch and supper, read paper.  Patient did notify her sister that she was here and obtained the security number for [facility].  RN called [facility] security for patient to help her understand what happened to her as she cannot remember.  Patient A&Ox4 otherwise, walks to bathroom with walker and standby assist from RN.  Did place patient on a low bed d/t her fall and taking Eliquis.

## 2017-05-04 NOTE — ED Notes (Signed)
Patient is a resident of [facility].  Medicines and care managed by home health.  Attempted to call facility and no answer.

## 2017-05-05 ENCOUNTER — Observation Stay (HOSPITAL_COMMUNITY): Payer: Medicare Other

## 2017-05-05 ENCOUNTER — Other Ambulatory Visit (HOSPITAL_COMMUNITY): Payer: Medicare Other

## 2017-05-05 ENCOUNTER — Observation Stay (HOSPITAL_BASED_OUTPATIENT_CLINIC_OR_DEPARTMENT_OTHER): Payer: Medicare Other

## 2017-05-05 DIAGNOSIS — S0101XA Laceration without foreign body of scalp, initial encounter: Secondary | ICD-10-CM | POA: Diagnosis not present

## 2017-05-05 DIAGNOSIS — I951 Orthostatic hypotension: Secondary | ICD-10-CM | POA: Diagnosis not present

## 2017-05-05 DIAGNOSIS — R55 Syncope and collapse: Secondary | ICD-10-CM

## 2017-05-05 DIAGNOSIS — W19XXXA Unspecified fall, initial encounter: Secondary | ICD-10-CM | POA: Diagnosis not present

## 2017-05-05 DIAGNOSIS — I350 Nonrheumatic aortic (valve) stenosis: Secondary | ICD-10-CM

## 2017-05-05 DIAGNOSIS — G20 Parkinson's disease: Secondary | ICD-10-CM | POA: Diagnosis not present

## 2017-05-05 LAB — ECHOCARDIOGRAM COMPLETE
HEIGHTINCHES: 65 in
Weight: 1891.2 oz

## 2017-05-05 LAB — TROPONIN I: Troponin I: <0.03 ng/mL (ref <0.03)

## 2017-05-05 NOTE — Evaluation (Signed)
Occupational Therapy Evaluation Patient Details Name: Nichole Krueger MRN: [id number] DOB: 1936-08-26 Today's Date: 05/05/2017    History of Present Illness Patient is a 81 y/o female who presents s/p fall. rule out syncope vs seizure. PMH includes PD, A-fib, DVT, PE, macular degeneration. Pt with head trauma/scalp laceration stapled in ED.    Clinical Impression   Pt reports she was very independent with ADL PTA. Currently pt overall min assist for ADL and functional mobility with the exception of mod assist for LB ADL. Pt presenting with impaired cognition, impaired balance, tremors, generalized weakness, and hx of falls impacting her independence and safety with ADL and functional mobility. Recommending short term SNF for follow up to maximize independence and safety with ADL and functional mobility prior to return home alone to ILF. Pt would benefit from continued skilled OT to address established goals.    Follow Up Recommendations  SNF;Supervision/Assistance - 24 hour    Equipment Recommendations  None recommended by OT    Recommendations for Other Services       Precautions / Restrictions Precautions Precautions: Fall Restrictions Weight Bearing Restrictions: No      Mobility Bed Mobility               General bed mobility comments: Pt OOB in chair upon arrival  Transfers Overall transfer level: Needs assistance Equipment used: Rolling walker (2 wheeled) Transfers: Sit to/from Stand Sit to Stand: Min assist         General transfer comment: Assist to boost up from chair into standing and for balance once in standing. Good hand placement    Balance Overall balance assessment: Needs assistance Sitting-balance support: Feet supported;No upper extremity supported Sitting balance-Leahy Scale: Fair     Standing balance support: Bilateral upper extremity supported Standing balance-Leahy Scale: Poor Standing balance comment: RW for support                            ADL either performed or assessed with clinical judgement   ADL Overall ADL's : Needs assistance/impaired Eating/Feeding: Set up;Sitting   Grooming: Minimal assistance;Sitting   Upper Body Bathing: Minimal assistance;Sitting   Lower Body Bathing: Moderate assistance;Sit to/from stand   Upper Body Dressing : Minimal assistance;Sitting   Lower Body Dressing: Moderate assistance;Sit to/from stand Lower Body Dressing Details (indicate cue type and reason): Pt able to reach down to foot but difficulty managing socks due to tremors Toilet Transfer: Minimal assistance;Ambulation;RW Toilet Transfer Details (indicate cue type and reason): Simulated by sit to stand from chair with functional mobility in room         Functional mobility during ADLs: Minimal assistance;Rolling walker       Vision Baseline Vision/History: Wears glasses;Cataracts (supposed to have cataract sx this week)       Perception     Praxis      Pertinent Vitals/Pain Pain Assessment: Faces Faces Pain Scale: Hurts even more Pain Location: RLE Pain Descriptors / Indicators: Tightness;Sore Pain Intervention(s): Monitored during session;Limited activity within patient's tolerance;Patient requesting pain meds-RN notified     Hand Dominance Right   Extremity/Trunk Assessment Upper Extremity Assessment Upper Extremity Assessment: Generalized weakness (R UE tremors)   Lower Extremity Assessment Lower Extremity Assessment: Defer to PT evaluation   Cervical / Trunk Assessment Cervical / Trunk Assessment: Kyphotic   Communication Communication Communication: No difficulties   Cognition Arousal/Alertness: Awake/alert Behavior During Therapy: WFL for tasks assessed/performed Overall Cognitive Status: No family/caregiver present to determine  baseline cognitive functioning Area of Impairment: Memory;Problem solving                     Memory: Decreased short-term memory        Problem Solving: Slow processing;Decreased initiation;Requires verbal cues General Comments: Answers questions appropriately. At times repeating herself. Does not know how she fell   General Comments       Exercises     Shoulder Instructions      Home Living Family/patient expects to be discharged to:: Other (Comment) ([facility]) Living Arrangements: Alone Available Help at Discharge: Family;Available PRN/intermittently Type of Home: Independent living facility Home Access: Elevator     Home Layout: One level     Bathroom Shower/Tub: Producer, television/film/video: Handicapped height Bathroom Accessibility: Yes   Home Equipment: Environmental consultant - 2 wheels;Walker - 4 wheels          Prior Functioning/Environment Level of Independence: Independent with assistive device(s)        Comments: RW for mobility. Independent with ADL; cooks        OT Problem List: Decreased strength;Decreased activity tolerance;Impaired balance (sitting and/or standing);Decreased cognition;Decreased safety awareness;Decreased knowledge of use of DME or AE;Impaired UE functional use;Pain      OT Treatment/Interventions: Self-care/ADL training;Therapeutic exercise;Energy conservation;DME and/or AE instruction;Therapeutic activities;Patient/family education;Balance training    OT Goals(Current goals can be found in the care plan section) Acute Rehab OT Goals Patient Stated Goal: rehab to get stronger OT Goal Formulation: With patient Time For Goal Achievement: 05/19/17 Potential to Achieve Goals: Good ADL Goals Pt Will Perform Grooming: with supervision;standing Pt Will Perform Lower Body Bathing: with supervision;sit to/from stand Pt Will Perform Lower Body Dressing: with supervision;sit to/from stand Pt Will Transfer to Toilet: with supervision;ambulating;bedside commode (over toilet) Pt Will Perform Toileting - Clothing Manipulation and hygiene: with supervision;sit to/from stand  OT  Frequency: Min 2X/week   Barriers to D/C: Decreased caregiver support  pt lives alone at [facility]       Co-evaluation              AM-PAC PT "6 Clicks" Daily Activity     Outcome Measure Help from another person eating meals?: None Help from another person taking care of personal grooming?: A Little Help from another person toileting, which includes using toliet, bedpan, or urinal?: A Little Help from another person bathing (including washing, rinsing, drying)?: A Lot Help from another person to put on and taking off regular upper body clothing?: A Little Help from another person to put on and taking off regular lower body clothing?: A Lot 6 Click Score: 17   End of Session Equipment Utilized During Treatment: Gait belt;Rolling walker Nurse Communication: Mobility status;Patient requests pain meds;Other (comment) (pt requesting Parkinson's meds)  Activity Tolerance: Patient tolerated treatment well Patient left: in chair;with call bell/phone within reach;with chair alarm set  OT Visit Diagnosis: Unsteadiness on feet (R26.81);Other abnormalities of gait and mobility (R26.89);History of falling (Z91.81);Muscle weakness (generalized) (M62.81);Pain Pain - Right/Left: Right Pain - part of body: Leg                Time: 1351-1410 OT Time Calculation (min): 19 min Charges:  OT General Charges $OT Visit: 1 Procedure OT Evaluation $OT Eval Moderate Complexity: 1 Procedure G-Codes: OT G-codes **NOT FOR INPATIENT CLASS** Functional Assessment Tool Used: AM-PAC 6 Clicks Daily Activity Functional Limitation: Self care Self Care Current Status (Z6109): At least 40 percent but less than 60 percent impaired, limited  or restricted Self Care Goal Status [phone](G8988): At least 1 percent but less than 20 percent impaired, limited or restricted   Fredric MareBailey A. Brett Albinooffey, M.S., OTR/L Pager: [phone]  Nichole Krueger 05/05/2017, 2:21 PM

## 2017-05-05 NOTE — Progress Notes (Signed)
  Echocardiogram 2D Echocardiogram has been performed.  Arvil ChacoFoster, Connelly [place] 05/05/2017, 5:16 PM

## 2017-05-05 NOTE — Clinical Social Work Note (Signed)
Clinical Social Work Assessment  Patient Details  Name: Nichole Krueger MRN: [id number] Date of Birth: [birth year]  Date of referral:  05/05/17               Reason for consult:  Discharge Planning                Permission sought to share information with:  Chartered certified accountant granted to share information::  Yes, Verbal Permission Granted  Name::        Agency::  [facility]  Relationship::     Contact Information:     Housing/Transportation Living arrangements for the past 2 months:  [place] of Information:  Patient, Medical Team, Facility Patient Interpreter Needed:  None Criminal Activity/Legal Involvement Pertinent to Current Situation/Hospitalization:  No - Comment as needed Significant Relationships:  Siblings Lives with:  Facility Resident Do you feel safe going back to the place where you live?  Yes Need for family participation in patient care:  Yes (Comment)  Care giving concerns:  Patient is from [place]. Patient is interested in the SNF side.   Social Worker assessment / plan:  CSW met with patient. No supports at bedside. CSW introduced role and explained that discharge planning would be discussed. Patient confirmed that she is from [place] and is somewhat agreeable to transitioning to the SNF side for short-term rehab. She wants to see what the MD's say first before making any decisions. CSW confirmed with admissions coordinator that they would be able to take her. Admissions coordinator stated they have a private room for her and she could get 30 days of rehab at no cost. Per admissions coordinator, they think SNF is the best option for the patient at this time because patient has had to call staff to help her more frequently. Patient stated that she moved to [place] from [place], [place]. Her two sisters also live at the facility. [facility] is pending due to Parkinson's diagnosis. Patient cannot discharge to  SNF without [facility]. No further concerns. CSW encouraged patient to contact CSW as needed. CSW will continue to follow patient for support and facilitate discharge to SNF once medically stable.  Employment status:  Retired Nurse, adult PT Recommendations:  Home with [place] / Referral to community resources:  [place]  Patient/Family's Response to care:  Patient is somewhat agreeable to transitioning to the SNF side but wants to see what the MD says first. Patient's sisters supportive and involved in patient's care. Patient appreciated social work intervention.  Patient/Family's Understanding of and Emotional Response to Diagnosis, Current Treatment, and Prognosis:  Patient has a good understanding of the reason for admission and her need for rehab prior to returning to [place]. Patient stated that with Parkinson's everyone has their ups and downs. Patient appears happy with hospital care.  Emotional Assessment Appearance:  Appears stated age Attitude/Demeanor/Rapport:  Other (Pleasant) Affect (typically observed):  Accepting, Appropriate, Calm, Pleasant Orientation:  Oriented to Self, Oriented to Place, Oriented to  Time, Oriented to Situation Alcohol / Substance use:  Never Used Psych involvement (Current and /or in the community):  No (Comment)  Discharge Needs  Concerns to be addressed:  Care Coordination Readmission within the last 30 days:  No Current discharge risk:  Dependent with Mobility, Lives alone Barriers to Discharge:  Continued Medical Work up, [place] ([facility])   Candie Chroman, LCSW 05/05/2017, 3:12 PM

## 2017-05-05 NOTE — Progress Notes (Addendum)
PROGRESS NOTE    Nichole Krueger  ZOX:[id number] DOB: 08-25-1936 DOA: 05/04/2017 PCP: [name], MD   Outpatient Specialists:    Brief Narrative:  Nichole Krueger is a 81 y.o. female with PMH of a fib, dvt on eliques, parkinson's disease presented after a fall. Patient lives at the [facility] retirement community with her sisters. Patient is alert, oriented but could not remember the fall. I called and discussed with her sisters at ALF. She reports no recent history of recurrent falls. - She was found by a staff member on the floor in pool of blood due to head/trauma laceration injury. She states that she does not remember having a fall or syncope. She was confused when she woke up on the floor. No tongue biting or urinary or fecal incontinence, no focal weakness or paresthesias. She reports walking with a walker and gait ataxia due to parkinson's disease. She denies any chest pains, no shortness of breath, no nausea, vomiting or diarrhea, no fevers, no dysuria.    Assessment & Plan:   Active Problems:   Syncope   Fall, head trauma/scalp laceration.  -Neuro exam is non focal. Ct head: No acute intracranial abnormalities. Ecg: appears sinus rhythm. No s/ sof systemic infection. UA: unremarkable -orthos + -repeat orthos today -tele -EEG negative -echo -Admitting dr. York Spaniel d/w E. Lindzen. Who recommended to cont antiparkinson meds for now. He thought that seizure is less likely  A fib/PE/dvt -eliquis -asked for records from PCP in [facility] -per patient, was to come off eliquis this month  PT: SNF     DVT prophylaxis:  Fully anticoagulated   Code Status: Full Code   Family Communication:   Disposition Plan:     Consultants:        Subjective: Doesn't remember what happened yesterday  Objective: Vitals:   05/04/17 1315 05/04/17 1958 05/05/17 0518 05/05/17 1258  BP: (!) 161/85 (!) 131/56 123/82 121/68  Pulse: 96 79 87 91  Resp: 16 18 18 20     Temp: 98.4 F (36.9 C) 98.4 F (36.9 C) 98.1 F (36.7 C) 98.2 F (36.8 C)  TempSrc: Oral Oral Oral Oral  SpO2: 98% 100% 100% 98%  Weight: 53.5 kg (117 lb 14.4 oz)  53.6 kg (118 lb 3.2 oz)   Height: 5\' 5"  (1.651 m)       Intake/Output Summary (Last 24 hours) at 05/05/17 1457 Last data filed at 05/05/17 1301  Gross per 24 hour  Intake              240 ml  Output              500 ml  Net             -260 ml   Filed Weights   05/04/17 1315 05/05/17 0518  Weight: 53.5 kg (117 lb 14.4 oz) 53.6 kg (118 lb 3.2 oz)    Examination:  General exam: Appears calm and comfortable-- tremors at baseline Respiratory system: Clear to auscultation. Respiratory effort normal. Cardiovascular system: RRR. No JVD, murmurs, rubs, gallops or clicks. No pedal edema. Gastrointestinal system: Abdomen is nondistended, soft and nontender. No organomegaly or masses felt. Normal bowel sounds heard. Central nervous system: Alert and oriented Skin: No rashes, lesions or ulcers Psychiatry: Judgement and insight appear normal. Mood & affect appropriate.     Data Reviewed: I have personally reviewed following labs and imaging studies  CBC:  Recent Labs Lab 05/04/17 0943  WBC 5.4  NEUTROABS 4.0  HGB 10.7*  HCT 32.9*  MCV 97.1  PLT 208   Basic Metabolic Panel:  Recent Labs Lab 05/04/17 0943  NA 139  K 3.9  CL 106  CO2 25  GLUCOSE 114*  BUN 16  CREATININE 0.60  CALCIUM 8.9   GFR: Estimated Creatinine Clearance: 49 mL/min (by C-G formula based on SCr of 0.6 mg/dL). Liver Function Tests:  Recent Labs Lab 05/04/17 0943  AST 21  ALT 12*  ALKPHOS 85  BILITOT 0.7  PROT 6.3*  ALBUMIN 3.6   No results for input(s): LIPASE, AMYLASE in the last 168 hours. No results for input(s): AMMONIA in the last 168 hours. Coagulation Profile: No results for input(s): INR, PROTIME in the last 168 hours. Cardiac Enzymes:  Recent Labs Lab 05/04/17 1347 05/04/17 1906 05/05/17 0114  TROPONINI  <0.03 <0.03 <0.03   BNP (last 3 results) No results for input(s): PROBNP in the last 8760 hours. HbA1C: No results for input(s): HGBA1C in the last 72 hours. CBG: No results for input(s): GLUCAP in the last 168 hours. Lipid Profile: No results for input(s): CHOL, HDL, LDLCALC, TRIG, CHOLHDL, LDLDIRECT in the last 72 hours. Thyroid Function Tests:  Recent Labs  05/04/17 1347  TSH 0.900   Anemia Panel: No results for input(s): VITAMINB12, FOLATE, FERRITIN, TIBC, IRON, RETICCTPCT in the last 72 hours. Urine analysis:    Component Value Date/Time   COLORURINE YELLOW 05/04/2017 0943   APPEARANCEUR CLEAR 05/04/2017 0943   LABSPEC 1.013 05/04/2017 0943   PHURINE 7.0 05/04/2017 0943   GLUCOSEU NEGATIVE 05/04/2017 0943   HGBUR NEGATIVE 05/04/2017 0943   BILIRUBINUR NEGATIVE 05/04/2017 0943   KETONESUR NEGATIVE 05/04/2017 0943   PROTEINUR NEGATIVE 05/04/2017 0943   NITRITE NEGATIVE 05/04/2017 0943   LEUKOCYTESUR TRACE (A) 05/04/2017 0943     )No results found for this or any previous visit (from the past 240 hour(s)).    Anti-infectives    None       Radiology Studies: Ct Head Wo Contrast  Result Date: 05/04/2017 CLINICAL DATA:  Fall. EXAM: CT HEAD WITHOUT CONTRAST CT CERVICAL SPINE WITHOUT CONTRAST TECHNIQUE: Multidetector CT imaging of the head and cervical spine was performed following the standard protocol without intravenous contrast. Multiplanar CT image reconstructions of the cervical spine were also generated. COMPARISON:  01/30/2017 FINDINGS: CT HEAD FINDINGS Brain: No evidence of acute infarction, hemorrhage, hydrocephalus, extra-axial collection or mass lesion/mass effect. Vascular: No hyperdense vessel or unexpected calcification. Skull: Normal. Negative for fracture or focal lesion. Sinuses/Orbits: No acute finding. Other: None. CT CERVICAL SPINE FINDINGS Image detail degraded by motion artifact. Alignment: Normal. Skull base and vertebrae: No acute fracture. No  primary bone lesion or focal pathologic process. Soft tissues and spinal canal: No prevertebral fluid or swelling. No visible canal hematoma. Disc levels: Multi level disc space narrowing and ventral endplate spurring noted. Most advanced at C4-5, C5-6, and C6-7. Upper chest: Negative. Other: None IMPRESSION: 1. No acute intracranial abnormalities. 2. Cervical spine detail degraded by motion artifact. No fracture or dislocation identified 3. Multilevel degenerative disc disease with cervical spine Electronically Signed   By: Signa Kell M.D.   On: 05/04/2017 10:13   Ct Cervical Spine Wo Contrast  Result Date: 05/04/2017 CLINICAL DATA:  Fall. EXAM: CT HEAD WITHOUT CONTRAST CT CERVICAL SPINE WITHOUT CONTRAST TECHNIQUE: Multidetector CT imaging of the head and cervical spine was performed following the standard protocol without intravenous contrast. Multiplanar CT image reconstructions of the cervical spine were also generated. COMPARISON:  01/30/2017 FINDINGS: CT HEAD FINDINGS Brain:  No evidence of acute infarction, hemorrhage, hydrocephalus, extra-axial collection or mass lesion/mass effect. Vascular: No hyperdense vessel or unexpected calcification. Skull: Normal. Negative for fracture or focal lesion. Sinuses/Orbits: No acute finding. Other: None. CT CERVICAL SPINE FINDINGS Image detail degraded by motion artifact. Alignment: Normal. Skull base and vertebrae: No acute fracture. No primary bone lesion or focal pathologic process. Soft tissues and spinal canal: No prevertebral fluid or swelling. No visible canal hematoma. Disc levels: Multi level disc space narrowing and ventral endplate spurring noted. Most advanced at C4-5, C5-6, and C6-7. Upper chest: Negative. Other: None IMPRESSION: 1. No acute intracranial abnormalities. 2. Cervical spine detail degraded by motion artifact. No fracture or dislocation identified 3. Multilevel degenerative disc disease with cervical spine Electronically Signed   By: Signa Kellaylor   Stroud M.D.   On: 05/04/2017 10:13        Scheduled Meds: . apixaban  5 mg Oral BID  . carbidopa-levodopa  2 tablet Oral QHS  . carbidopa-levodopa  2 tablet Oral QID  . pramipexole  0.25 mg Oral QHS  . selegiline  5 mg Oral BID WC  . sodium chloride flush  3 mL Intravenous Q12H   Continuous Infusions:   LOS: 0 days    Time spent: 25 min    JESSICA U VANN, DO [facility] Pager [phone]  If 7PM-7AM, please contact night-coverage [email or web address] Password [email or web address] 05/05/2017, 2:57 PM

## 2017-05-05 NOTE — Procedures (Signed)
ELECTROENCEPHALOGRAM REPORT  Date of Study: 05/05/2017  Patient's Name: Nichole Nestleancy Krueger MRN: [id number] Date of Birth: 1936-07-12  Referring Provider: Esperanza SheetsUlugbek N Buriev, MD  Clinical History: 81 year old woman with atrial fibrillation and Parkinson's disease who presents after found confused status post unwitnessed fall.    Medications: acetaminophen (TYLENOL) tablet 650 mg  ALPRAZolam (XANAX) tablet 0.25 mg  apixaban (ELIQUIS) tablet 5 mg  baclofen (LIORESAL) tablet 10 mg  carbidopa-levodopa tpramipexole (MIRAPEX) tablet 0.25 mg  selegiline (ELDEPRYL) capsule 5 mg   Technical Summary: A multichannel digital EEG recording measured by the international 10-20 system with electrodes applied with paste and impedances below 5000 ohms performed in our laboratory with EKG monitoring in an awake and drowsy patient.  Hyperventilation was not performed.  Photic stimulation was performed.  The digital EEG was referentially recorded, reformatted, and digitally filtered in a variety of bipolar and referential montages for optimal display.    Description: The patient is awake and drowsy during the recording.  During maximal wakefulness, there is a symmetric, medium voltage 10 Hz posterior dominant rhythm that attenuates with eye opening.  The record is symmetric.  During drowsiness, there is an increase in theta slowing of the background.  Stage 2 sleep was not seen.  Photic stimulation did not elicit any abnormalities.  There were no epileptiform discharges or electrographic seizures seen.    EKG lead was unremarkable.  Impression: This awake and drowsy EEG is normal.    Clinical Correlation: A normal EEG does not exclude a clinical diagnosis of epilepsy.  If further clinical questions remain, prolonged EEG may be helpful.  Clinical correlation is advised.  Shon MilletAdam Jaffe, DO

## 2017-05-05 NOTE — Care Management Note (Addendum)
Case Management Note  Patient Details  Name: Nichole Nestleancy Krueger MRN: [id number] Date of Birth: 05/22/1936  Subjective/Objective:  Admitted with Syncope                 Action/Plan: Patient lives at an Independent Living Facility; PCP: Nichole Krueger, Hal, MD; has private insurance with [facility] with prescription drug coverage; CM talked to patient about DCP; patient with inability to move from the bed to the chair without assistance x2; she is agreeable to go to SNF short term; Nichole SagoSarah SW made aware  Expected Discharge Date:     Possibly 05/08/2017             Expected Discharge Plan:  Home w Home Health Services  In-House Referral:   SW  Discharge planning Services  CM Consult   Status of Service:  In process, will continue to follow  Reola MosherChandler, Ella Golomb L, RN,MHA,BSN [phone] 05/05/2017, 10:45 AM

## 2017-05-05 NOTE — Progress Notes (Signed)
Pt states she takes her medications an hour before meal times, specifically her carbidopa levodopa.  Called pharmacy and had them re schedule her medications   Nichole Krueger

## 2017-05-05 NOTE — [place] FL2 (Signed)
[place] MEDICAID FL2 LEVEL OF CARE SCREENING TOOL     IDENTIFICATION  Patient Name: Nichole Krueger Birthdate: Feb 04, 1936 Sex: female Admission Date (Current Location): 05/04/2017  [facility] [place]   Facility and Address:  [facility] [place], [address], [place], [place] [address]      Provider Number: [id number]  Attending Physician Name and Address:  Joseph ArtVann, Jessica U, DO  Relative Name and Phone Number:       Current Level of Care: Hospital Recommended Level of Care: Skilled Nursing Facility Prior Approval Number:    Date Approved/Denied:   PASRR Number: Manual review  Discharge Plan: SNF    Current Diagnoses: Patient Active Problem List   Diagnosis Date Noted  . Syncope 05/04/2017  . Muscle rigidity 01/31/2017  . AF (paroxysmal atrial fibrillation) (HCC) 01/31/2017  . Parkinson's disease (HCC) 01/31/2017    Orientation RESPIRATION BLADDER Height & Weight     Self, Time, Situation, Place  Normal Continent Weight: 118 lb 3.2 oz (53.6 kg) (scale c) Height:  5\' 5"  (165.1 cm)  BEHAVIORAL SYMPTOMS/MOOD NEUROLOGICAL BOWEL NUTRITION STATUS   (None)  (None) Continent Diet (Regular)  AMBULATORY STATUS COMMUNICATION OF NEEDS Skin   Limited Assist Verbally Other (Comment) (Laceration: Left head.)                       Personal Care Assistance Level of Assistance  Bathing, Feeding, Dressing Bathing Assistance: Limited assistance Feeding assistance: Limited assistance Dressing Assistance: Limited assistance     Functional Limitations Info  Sight, Hearing, Speech Sight Info: Adequate Hearing Info: Adequate Speech Info: Adequate    SPECIAL CARE FACTORS FREQUENCY  PT (By licensed PT), OT (By licensed OT)     PT Frequency: 5 x week OT Frequency: 5 x week            Contractures Contractures Info: Not present    Additional Factors Info  Code Status, Allergies Code Status Info: Full Allergies Info: Other,  Thorazine (Chlorpromazine)           Current Medications (05/05/2017):  This is the current hospital active medication list Current Facility-Administered Medications  Medication Dose Route Frequency Provider Last Rate Last Dose  . acetaminophen (TYLENOL) tablet 650 mg  650 mg Oral Q6H PRN Esperanza SheetsBuriev, Ulugbek N, MD   650 mg at 05/05/17 1420  . ALPRAZolam (XANAX) tablet 0.25 mg  0.25 mg Oral QHS PRN Esperanza SheetsBuriev, Ulugbek N, MD      . apixaban (ELIQUIS) tablet 5 mg  5 mg Oral BID Esperanza SheetsBuriev, Ulugbek N, MD   5 mg at 05/05/17 1040  . baclofen (LIORESAL) tablet 10 mg  10 mg Oral TID PRN Esperanza SheetsBuriev, Ulugbek N, MD      . carbidopa-levodopa (SINEMET CR) 50-200 MG per tablet controlled release 2 tablet  2 tablet Oral QHS Esperanza SheetsBuriev, Ulugbek N, MD   2 tablet at 05/04/17 2229  . carbidopa-levodopa (SINEMET IR) 25-100 MG per tablet immediate release 0.5 tablet  0.5 tablet Oral Daily PRN Buriev, Isaiah SergeUlugbek N, MD      . carbidopa-levodopa (SINEMET IR) 25-100 MG per tablet immediate release 2 tablet  2 tablet Oral QID Esperanza SheetsBuriev, Ulugbek N, MD   2 tablet at 05/05/17 1040  . lip balm (BLISTEX) ointment   Topical PRN Esperanza SheetsBuriev, Ulugbek N, MD      . pramipexole (MIRAPEX) tablet 0.25 mg  0.25 mg Oral QHS Esperanza SheetsBuriev, Ulugbek N, MD   0.25 mg at 05/04/17 2229  .  selegiline (ELDEPRYL) capsule 5 mg  5 mg Oral BID WC Esperanza Sheets, MD   5 mg at 05/05/17 0731  . sodium chloride flush (NS) 0.9 % injection 3 mL  3 mL Intravenous Q12H Buriev, Isaiah Serge, MD   3 mL at 05/05/17 1000     Discharge Medications: Please see discharge summary for a list of discharge medications.  Relevant Imaging Results:  Relevant Lab Results:   Additional Information SS#: [id number]  Margarito Liner, LCSW

## 2017-05-05 NOTE — Progress Notes (Signed)
Patient out of bed to chair with 2 person assist and walker. Patient tolerated well. Chair alarm in use.

## 2017-05-05 NOTE — Progress Notes (Signed)
  Echocardiogram 2D Echocardiogram has been performed.  Arvil ChacoFoster, Kaye Mitro 05/05/2017, 4:29 PM

## 2017-05-05 NOTE — Progress Notes (Signed)
EEG completed, results pending. 

## 2017-05-05 NOTE — Progress Notes (Signed)
Pt signed [facility] and [facility] [place]  Shuronda Santino

## 2017-05-06 DIAGNOSIS — G20 Parkinson's disease: Secondary | ICD-10-CM

## 2017-05-06 DIAGNOSIS — I951 Orthostatic hypotension: Secondary | ICD-10-CM | POA: Diagnosis not present

## 2017-05-06 DIAGNOSIS — S0101XA Laceration without foreign body of scalp, initial encounter: Secondary | ICD-10-CM | POA: Diagnosis not present

## 2017-05-06 LAB — GLUCOSE, CAPILLARY
GLUCOSE-CAPILLARY: 101 mg/dL — AB (ref 65–99)
Glucose-Capillary: 96 mg/dL (ref 65–99)

## 2017-05-06 MED ORDER — CARBIDOPA-LEVODOPA 25-100 MG PO TABS: 1.0000 | ORAL_TABLET | Freq: Every evening | ORAL | Status: DC | PRN

## 2017-05-06 MED ORDER — ALPRAZOLAM 0.25 MG PO TABS: 0.2500 mg | ORAL_TABLET | Freq: Every day | ORAL | 1 refills | Status: DC | PRN

## 2017-05-06 NOTE — Progress Notes (Signed)
Physical Therapy Treatment Patient Details Name: Nichole Krueger MRN: [id number] DOB: [birth year] Today's Date: 05/06/2017    History of Present Illness Patient is a 81 y/o female who presents s/p fall. rule out syncope vs seizure. PMH includes PD, A-fib, DVT, PE, macular degeneration. Pt with head trauma/scalp laceration stapled in ED.     PT Comments    Patient alert and eager to mobilize. Pt tolerated ambulating 16300ft with min guard assist for safety. VSS.  Current plan remains appropriate.   Follow Up Recommendations  Home health PT;Supervision for mobility/OOB;Supervision/Assistance - 24 hour     Equipment Recommendations  None recommended by PT    Recommendations for Other Services OT consult     Precautions / Restrictions Precautions Precautions: Fall    Mobility  Bed Mobility               General bed mobility comments: Pt OOB in chair upon arrival  Transfers Overall transfer level: Needs assistance Equipment used: Rolling walker (2 wheeled) Transfers: Sit to/from Stand Sit to Stand: Supervision         General transfer comment: supervision for safety; increasd time and X2 attempts to power up into standing  Ambulation/Gait Ambulation/Gait assistance: Min guard Ambulation Distance (Feet): 100 Feet Assistive device: Rolling walker (2 wheeled) Gait Pattern/deviations: Step-through pattern;Decreased stride length;Decreased dorsiflexion - right;Decreased dorsiflexion - left;Trunk flexed;Narrow base of support Gait velocity: decreased   General Gait Details: min guard for safety; cues for posture and proximity of RW; almost a shuffling gait initially but improved with distance; pt able to increase cadence with cues; difficulty turning and with directional changes   Stairs            Wheelchair Mobility    Modified Rankin (Stroke Patients Only)       Balance   Sitting-balance support: Feet supported;No upper extremity supported Sitting  balance-Leahy Scale: Fair     Standing balance support: Bilateral upper extremity supported Standing balance-Leahy Scale: Poor Standing balance comment: RW for support                            Cognition Arousal/Alertness: Awake/alert Behavior During Therapy: WFL for tasks assessed/performed Overall Cognitive Status: Within Functional Limits for tasks assessed                                        Exercises      General Comments General comments (skin integrity, edema, etc.): VSS; pt reported being more tremorous today and with decreased cadence vs PTA      Pertinent Vitals/Pain Pain Assessment: No/denies pain    Home Living                      Prior Function            PT Goals (current goals can now be found in the care plan section) Progress towards PT goals: Progressing toward goals    Frequency    Min 3X/week      PT Plan Current plan remains appropriate    Co-evaluation              AM-PAC PT "6 Clicks" Daily Activity  Outcome Measure  Difficulty turning over in bed (including adjusting bedclothes, sheets and blankets)?: None Difficulty moving from lying on back to sitting on the side of the bed? :  Total Difficulty sitting down on and standing up from a chair with arms (e.g., wheelchair, bedside commode, etc,.)?: Total Help needed moving to and from a bed to chair (including a wheelchair)?: A Little Help needed walking in hospital room?: A Little Help needed climbing 3-5 steps with a railing? : A Lot 6 Click Score: 14    End of Session Equipment Utilized During Treatment: Gait belt Activity Tolerance: Patient tolerated treatment well Patient left: in chair;with call bell/phone within reach;with chair alarm set Nurse Communication: Mobility status PT Visit Diagnosis: Unsteadiness on feet (R26.81);Other abnormalities of gait and mobility (R26.89)     Time: 3244-0102 PT Time Calculation (min) (ACUTE  ONLY): 26 min  Charges:  $Gait Training: 8-22 mins $Therapeutic Activity: 8-22 mins                    G Codes:       Erline Levine, PTA Pager: [phone]     Carolynne Edouard 05/06/2017, 10:09 AM

## 2017-05-06 NOTE — Progress Notes (Signed)
[facility] to [facility]. Was transferred to medical records by operator and left message on answering machine.

## 2017-05-06 NOTE — Discharge Summary (Signed)
Physician Discharge Summary  Nichole Krueger WUJ:[id number] DOB: Mar 20, 1936 DOA: 05/04/2017  PCP: [name], MD  Admit date: 05/04/2017 Discharge date: 05/06/2017   Recommendations for Outpatient Follow-Up:   1. pateint reported that prior PCP in [place] was to take her off eliquis (PE) this month--- requested records but none were sent-- patient with h/o a fib---- Ambulatory referral for outpatient 30 day event monitor 2.  Medication recommendations Per neuro at [facility] at most recent visit:  -Mirapex 0.25 mg QID (9am, 12pm, 3pm) -Mirapex 1mg  tablet-1 tablet by mouth at bedtime -Sinemet 25/100mg , 2 pills (6a, 9a, 12p, 3pm)  -Sinemet 25/100mg  1 pill 5pm PRN -Sinemet 50/200mg  ER PO 1 tab QHS  -Selegiline 5mg  - PO BID (9am, 3pm) 3.  Staple removal from head laceration in 7 days    Discharge Diagnosis:   Active Problems:   AF (paroxysmal atrial fibrillation) (HCC)   Parkinson's disease (HCC)   Orthostatic syncope   Discharge disposition:  SNF: [facility]  Discharge Condition: Improved.  Diet recommendation: Low sodium, heart healthy.  Wound care: None.   History of Present Illness:   Nichole Krueger is a 81 y.o. female with PMH of a fib, dvt on eliques, parkinson's disease presented after a fall. Patient lives at the [employer] community with her sisters. Patient is alert, oriented but could not remember the fall. I called and discussed with her sisters at ALF. She reports no recent history of recurrent falls. - She was found by a staff member on the floor in pool of blood due to head/trauma laceration injury. She states that she does not remember having a fall or syncope. She was confused when she woke up on the floor. No tongue biting or urinary or fecal incontinence, no focal weakness or paresthesias. She reports walking with a walker and gait ataxia due to parkinson's disease. She denies any chest pains, no shortness of breath, no nausea, vomiting or diarrhea,  no fevers, no dysuria.    Hospital Course by Problem:   Fall due to orthostatic hypotension with head trauma/scalp laceration.  -Neuro exam is non focal. Ct head: No acute intracranial abnormalities. Ecg: appears sinus rhythm. No s/ sof systemic infection. UA: unremarkable -orthos + -repeat orthos negative -tele- a fib but not clear-- will need 30 day event monitor -EEG negative -echo: Normal LV size with EF 55-60%. Moderate diastolic dysfunction.   Mild aortic insufficiency. Normal RV size and systolic function -Admitting dr. York Spaniel d/w E. Lindzen. Who recommended to cont antiparkinson meds and thought that seizure was less likely -will need staples removed in 7 days  A fib/PE/dvt -eliquis -asked for records from PCP in [facility] -per patient, was to come off eliquis this month for PE -?event monitor to r/o a fib if records from PCP not clear   Medical Consultants:    Neuro (phone)   Discharge Exam:   Vitals:   05/06/17 0359 05/06/17 1147  BP: (!) 160/71 (!) 132/58  Pulse: 78 77  Resp: 18 20  Temp: 97.8 F (36.6 C) 97.7 F (36.5 C)   Vitals:   05/06/17 0031 05/06/17 0304 05/06/17 0359 05/06/17 1147  BP: 129/69  (!) 160/71 (!) 132/58  Pulse: 77  78 77  Resp: 17  18 20   Temp: 98.3 F (36.8 C)  97.8 F (36.6 C) 97.7 F (36.5 C)  TempSrc: Oral  Oral Oral  SpO2: 99%  98% 99%  Weight:  53.9 kg (118 lb 12.8 oz)    Height:  Gen:  NAD   The results of significant diagnostics from this hospitalization (including imaging, microbiology, ancillary and laboratory) are listed below for reference.     Procedures and Diagnostic Studies:   Ct Head Wo Contrast  Result Date: 05/04/2017 CLINICAL DATA:  Fall. EXAM: CT HEAD WITHOUT CONTRAST CT CERVICAL SPINE WITHOUT CONTRAST TECHNIQUE: Multidetector CT imaging of the head and cervical spine was performed following the standard protocol without intravenous contrast. Multiplanar CT image reconstructions of the cervical spine  were also generated. COMPARISON:  01/30/2017 FINDINGS: CT HEAD FINDINGS Brain: No evidence of acute infarction, hemorrhage, hydrocephalus, extra-axial collection or mass lesion/mass effect. Vascular: No hyperdense vessel or unexpected calcification. Skull: Normal. Negative for fracture or focal lesion. Sinuses/Orbits: No acute finding. Other: None. CT CERVICAL SPINE FINDINGS Image detail degraded by motion artifact. Alignment: Normal. Skull base and vertebrae: No acute fracture. No primary bone lesion or focal pathologic process. Soft tissues and spinal canal: No prevertebral fluid or swelling. No visible canal hematoma. Disc levels: Multi level disc space narrowing and ventral endplate spurring noted. Most advanced at C4-5, C5-6, and C6-7. Upper chest: Negative. Other: None IMPRESSION: 1. No acute intracranial abnormalities. 2. Cervical spine detail degraded by motion artifact. No fracture or dislocation identified 3. Multilevel degenerative disc disease with cervical spine Electronically Signed   By: Signa Kellaylor  Stroud M.D.   On: 05/04/2017 10:13   Ct Cervical Spine Wo Contrast  Result Date: 05/04/2017 CLINICAL DATA:  Fall. EXAM: CT HEAD WITHOUT CONTRAST CT CERVICAL SPINE WITHOUT CONTRAST TECHNIQUE: Multidetector CT imaging of the head and cervical spine was performed following the standard protocol without intravenous contrast. Multiplanar CT image reconstructions of the cervical spine were also generated. COMPARISON:  01/30/2017 FINDINGS: CT HEAD FINDINGS Brain: No evidence of acute infarction, hemorrhage, hydrocephalus, extra-axial collection or mass lesion/mass effect. Vascular: No hyperdense vessel or unexpected calcification. Skull: Normal. Negative for fracture or focal lesion. Sinuses/Orbits: No acute finding. Other: None. CT CERVICAL SPINE FINDINGS Image detail degraded by motion artifact. Alignment: Normal. Skull base and vertebrae: No acute fracture. No primary bone lesion or focal pathologic process.  Soft tissues and spinal canal: No prevertebral fluid or swelling. No visible canal hematoma. Disc levels: Multi level disc space narrowing and ventral endplate spurring noted. Most advanced at C4-5, C5-6, and C6-7. Upper chest: Negative. Other: None IMPRESSION: 1. No acute intracranial abnormalities. 2. Cervical spine detail degraded by motion artifact. No fracture or dislocation identified 3. Multilevel degenerative disc disease with cervical spine Electronically Signed   By: Signa Kellaylor  Stroud M.D.   On: 05/04/2017 10:13     Labs:   Basic Metabolic Panel:  Recent Labs Lab 05/04/17 0943  NA 139  K 3.9  CL 106  CO2 25  GLUCOSE 114*  BUN 16  CREATININE 0.60  CALCIUM 8.9   GFR Estimated Creatinine Clearance: 49.3 mL/min (by C-G formula based on SCr of 0.6 mg/dL). Liver Function Tests:  Recent Labs Lab 05/04/17 0943  AST 21  ALT 12*  ALKPHOS 85  BILITOT 0.7  PROT 6.3*  ALBUMIN 3.6   No results for input(s): LIPASE, AMYLASE in the last 168 hours. No results for input(s): AMMONIA in the last 168 hours. Coagulation profile No results for input(s): INR, PROTIME in the last 168 hours.  CBC:  Recent Labs Lab 05/04/17 0943  WBC 5.4  NEUTROABS 4.0  HGB 10.7*  HCT 32.9*  MCV 97.1  PLT 208   Cardiac Enzymes:  Recent Labs Lab 05/04/17 1347 05/04/17 1906 05/05/17 0114  TROPONINI <0.03 <0.03 <0.03   BNP: Invalid input(s): POCBNP CBG:  Recent Labs Lab 05/06/17 0729 05/06/17 1145  GLUCAP 96 101*   D-Dimer No results for input(s): DDIMER in the last 72 hours. Hgb A1c No results for input(s): HGBA1C in the last 72 hours. Lipid Profile No results for input(s): CHOL, HDL, LDLCALC, TRIG, CHOLHDL, LDLDIRECT in the last 72 hours. Thyroid function studies  Recent Labs  05/04/17 1347  TSH 0.900   Anemia work up No results for input(s): VITAMINB12, FOLATE, FERRITIN, TIBC, IRON, RETICCTPCT in the last 72 hours. Microbiology No results found for this or any  previous visit (from the past 240 hour(s)).   Discharge Instructions:   Discharge Instructions    Diet general    Complete by:  As directed    Discharge instructions    Complete by:  As directed    Stay hydrated   Increase activity slowly    Complete by:  As directed      Allergies as of 05/06/2017      Reactions   Other Other (See Comments)   ANESTHESIA-HALLUCINATIONS   Thorazine [chlorpromazine] Other (See Comments)   Unknown      Medication List    STOP taking these medications   HYDROcodone-acetaminophen 5-325 MG tablet Commonly known as:  NORCO   LORazepam 1 MG tablet Commonly known as:  ATIVAN     TAKE these medications   ALPRAZolam 0.25 MG tablet Commonly known as:  XANAX Take 1 tablet (0.25 mg total) by mouth daily as needed for anxiety.   baclofen 10 MG tablet Commonly known as:  LIORESAL Take 1 tablet (10 mg total) by mouth 3 (three) times daily as needed for muscle spasms (leg muscle spasms).   carbidopa-levodopa 50-200 MG tablet Commonly known as:  SINEMET CR Take 1 tablet by mouth at bedtime. What changed:  Another medication with the same name was changed. Make sure you understand how and when to take each.   carbidopa-levodopa 25-100 MG tablet Commonly known as:  SINEMET IR Take 2 tablets by mouth 4 (four) times daily. 0600, 0900, 1200, 1500 What changed:  Another medication with the same name was changed. Make sure you understand how and when to take each.   carbidopa-levodopa 25-100 MG tablet Commonly known as:  SINEMET IR Take 1 tablet by mouth at bedtime as needed (TAKES 1/2 TAB ADDT'L AS NEEDED). What changed:  when to take this  reasons to take this   ELIQUIS 5 MG Tabs tablet Generic drug:  apixaban Take 5 mg by mouth 2 (two) times daily.   MULTIVITAMIN PO Take 1 tablet by mouth daily.   pramipexole 1 MG tablet Commonly known as:  MIRAPEX Take 1 mg by mouth at bedtime.   pramipexole 0.25 MG tablet Commonly known as:   MIRAPEX TAKES 0.25 MG BY MOUTH THREE TIMES DAILY   selegiline 5 MG capsule Commonly known as:  ELDEPRYL TAKES 5MG  BY MOUTH TWICE DAILY       Contact information for follow-up providers    Stoneking, Hal, MD Follow up in 1 week(s).   Specialty:  Internal Medicine Contact information: [address] [place] [address] [phone]            Contact information for after-discharge care    Destination    [facility] Follow up.   Specialty:  Skilled Nursing Facility Contact information: [address] [place] [address] [phone]  Time coordinating discharge: 25 min  Signed:  Tashonda Pinkus U Dacen Frayre   [facility] 05/06/2017, 12:24 PM

## 2017-05-06 NOTE — Clinical Social Work Note (Signed)
CSW facilitated patient discharge including contacting patient family and facility to confirm patient discharge plans. Clinical information faxed to facility and family agreeable with plan. CSW arranged ambulance transport via [facility] to [facility]. RN to call report prior to discharge ([phone]).  CSW will sign off for now as social work intervention is no longer needed. Please consult us again if new needs arise.  Delando Satter, CSW [phone]  

## 2017-05-06 NOTE — Progress Notes (Deleted)
Report called to RN at [facility].  °

## 2017-05-06 NOTE — Clinical Social Work Note (Signed)
PASARR obtained: [id number]  Charlynn CourtSarah Yashika Mask, CSW [phone]

## 2017-05-15 ENCOUNTER — Other Ambulatory Visit: Payer: Self-pay | Admitting: Geriatric Medicine

## 2017-05-15 DIAGNOSIS — Z9181 History of falling: Secondary | ICD-10-CM

## 2017-05-15 DIAGNOSIS — R410 Disorientation, unspecified: Secondary | ICD-10-CM

## 2017-05-16 ENCOUNTER — Ambulatory Visit
Admission: RE | Admit: 2017-05-16 | Discharge: 2017-05-16 | Disposition: A | Discharge status: Home / Self Care | Payer: Medicare Other | Source: Ambulatory Visit | Attending: Geriatric Medicine | Admitting: Geriatric Medicine

## 2017-05-16 DIAGNOSIS — Z9181 History of falling: Secondary | ICD-10-CM

## 2017-05-16 DIAGNOSIS — R410 Disorientation, unspecified: Secondary | ICD-10-CM

## 2017-06-26 ENCOUNTER — Encounter: Payer: Self-pay | Admitting: Neurology

## 2017-07-22 NOTE — Progress Notes (Signed)
Nichole Krueger was seen today in the movement disorders clinic for neurologic consultation at the request of [name], MD.  The consultation is for the evaluation of PD.  The records that were made available to me were reviewed.  Pt currently under the care of [name].  Last seen by [facility] in May, 2018.  Patient was diagnosed with Parkinson's disease in 1998.  Her first symptom was R hand tremor.  She cannot remember what her first medication was.  She states that she really did well until about 5 years ago  When she was last seen at [facility], her medication was slightly changed.  She was told to take her carbidopa/levodopa 25/100, 2 pills at 6 AM/9 AM/noon/3 PM and one pill at 6 PM and 9 PM.  This was a one pill increase but it doesn't appear that this was done and she is still on carbidopa/levodopa 25/100, 2 pills at 6 AM/9 AM/noon/3 PM and one pill at 5 PM prn.  She is also on carbidopa/levodopa 50/200 at bedtime.  She was told to take pramipexole 0.25 mg 3 times per day and then 2 tablets at bedtime; again, this does not appear to have been transcribed to the nursing facility and she is now on pramipexole 0.25 mg 3 times per day and 1 mg at bedtime.    She was to maintain selegiline 5 mg, one tablet twice per day and Aricept 5 mg nightly.  She is no longer on aricept when she returns today.  She is not completely happy with current regimen and thinks that has more dyskinesia after 9am/3pm dosage.     Specific Symptoms:  Tremor: Yes.  , occasionally (and now it can be in both hands but doesn't occur regulary) Family hx of similar:  No. (grandmother had some tremor) Voice: softer especially when tired Sleep: some trouble with sleep (may fall asleep in front of TV)  Vivid Dreams:  No.  Acting out dreams:  No. Wet Pillows: Yes.   Postural symptoms:  Yes.    Falls?  Yes.  , last one 2-3 months ago (was more of a passing out than a fall.  Doesn't recall much about event).  Has  not had other falls Bradykinesia symptoms: difficulty getting out of a chair and difficulty regaining balance Loss of smell:  Yes.   Loss of taste:  No. Urinary Incontinence:  No. Difficulty Swallowing:  Yes.   (some trouble with pills but does better with taking with bananas/applesauce) Handwriting, micrographia: Yes.   (when she wrote here it didn't reflect this but reports that it often is little) Trouble with ADL's:  Yes.   (cannot tie shoes but otherwise does dress self)  Trouble buttoning clothing: Yes.   Depression:  No. Memory changes:  Yes.  , trouble with names Hallucinations:  Yes.  , in the past she had vivid hallucination and called police.  This was few years ago and hasn't had since (this was in 2015)  visual distortions: Yes.   N/V:  No. Lightheaded:  Yes.   (occasionally)  Syncope: Yes.   (in early June she had passing out episode and fell and hit back.  Now seeing spine for LBP) Diplopia:  No. Dyskinesia:  Yes.   (has dyskinesia during "on" periods and dystonia in "off" periods).  Pt reports that dystonia manifests as inversion of ankles bilaterally and then the "cramping" will progress up the body.  She states that she uses baclofen now  for that 10 mg tid.  Neuroimaging of the brain has  previously been performed.  It is available for my review today.  CT brain done in 05/16/17 and it was unremarkable for acute process.  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 Other Other (See Comments)    ANESTHESIA-HALLUCINATIONS  . Thorazine [Chlorpromazine] Other (See Comments)    Unknown    CURRENT MEDICATIONS:  Outpatient Encounter Prescriptions as of 07/24/2017  Medication Sig  . ALPRAZolam (XANAX) 0.25 MG tablet Take 1 tablet (0.25 mg total) by mouth daily as needed for anxiety.  Marland Kitchen apixaban (ELIQUIS) 5 MG TABS tablet Take  5 mg by mouth 2 (two) times daily.  . baclofen (LIORESAL) 10 MG tablet Take 1 tablet (10 mg total) by mouth 3 (three) times daily as needed for muscle spasms (leg muscle spasms).  . carbidopa-levodopa (SINEMET CR) 50-200 MG tablet Take 1 tablet by mouth at bedtime.   . carbidopa-levodopa (SINEMET IR) 25-100 MG tablet Take 2 tablets by mouth 4 (four) times daily. 0600, 0900, 1200, 1500  . carbidopa-levodopa (SINEMET IR) 25-100 MG tablet Take 1 tablet by mouth at bedtime as needed (TAKES 1/2 TAB ADDT'L AS NEEDED). (Patient taking differently: Take 1 tablet by mouth as needed (at 5 pm). )  . moxifloxacin (VIGAMOX) 0.5 % ophthalmic solution 1 drop 3 (three) times daily.  Marland Kitchen neomycin-polymyxin-dexameth (MAXITROL) 0.1 % OINT 1 application.  . pramipexole (MIRAPEX) 0.25 MG tablet TAKES 0.25 MG BY MOUTH THREE TIMES DAILY  . pramipexole (MIRAPEX) 1 MG tablet Take 1 mg by mouth at bedtime.  . prednisoLONE acetate (PRED FORTE) 1 % ophthalmic suspension 1 drop.  . selegiline (ELDEPRYL) 5 MG capsule TAKES 5MG  BY MOUTH TWICE DAILY  . [DISCONTINUED] Multiple Vitamins-Minerals (MULTIVITAMIN PO) Take 1 tablet by mouth daily.   No facility-administered encounter medications on file as of 07/24/2017.     PAST MEDICAL HISTORY:   Past Medical History:  Diagnosis Date  . Anxiety   . Atrial fibrillation (HCC)   . Macular degeneration   . Parkinson's disease (HCC)   . Pulmonary embolism (HCC)     PAST SURGICAL HISTORY:   Past Surgical History:  Procedure Laterality Date  . BACK SURGERY  2017   x3  . CATARACT EXTRACTION Right   . EXTERNAL FIXATION ARM      SOCIAL HISTORY:   Social History   Social History  . Marital status: Widowed    Spouse name: N/A  . Number of children: N/A  . Years of education: N/A   Occupational History  . Not on file.   Social History Main Topics  . Smoking status: Never Smoker  . Smokeless tobacco: Never Used  . Alcohol use Yes     Comment: occasional wine  . Drug  use: No  . Sexual activity: Not on file   Other Topics Concern  . Not on file   Social History Narrative  . No narrative on file    FAMILY HISTORY:   Family Status  Relation Status  . Mother Deceased  . Father Deceased  . Sister (Not Specified)  . Son Alive    ROS:  A complete 10 system review of systems was obtained and was unremarkable apart from what is mentioned above.  PHYSICAL EXAMINATION:    VITALS:   Vitals:   07/24/17 0910  BP: 124/66  Pulse: 84  SpO2: 94%  Weight: 118 lb (53.5 kg)  Height: 5\' 4"  (1.626 m)    GEN:  The patient appears stated age and is in NAD. HEENT:  Normocephalic, atraumatic.  The mucous membranes are moist. The superficial temporal arteries are without ropiness or tenderness. CV:  RRR Lungs:  CTAB Neck/HEME:  There are no carotid bruits bilaterally.  Neurological examination:  Orientation:  Montreal Cognitive Assessment  07/24/2017  Visuospatial/ Executive (0/5) 5  Naming (0/3) 2  Attention: Read list of digits (0/2) 2  Attention: Read list of letters (0/1) 1  Attention: Serial 7 subtraction starting at 100 (0/3) 0  Language: Repeat phrase (0/2) 2  Language : Fluency (0/1) 1  Abstraction (0/2) 2  Delayed Recall (0/5) 2  Orientation (0/6) 6  Total 23  Adjusted Score (based on education) 23   Cranial nerves: There is good facial symmetry. Pupils are equal round and reactive to light bilaterally. Fundoscopic exam reveals clear margins bilaterally. Extraocular muscles are intact. The visual fields are full to confrontational testing. The speech is fluent and clear. Soft palate rises symmetrically and there is no tongue deviation. Hearing is intact to conversational tone. Sensation: Sensation is intact to light and pinprick throughout (facial, trunk, extremities). Vibration is intact at the bilateral big toe. There is no extinction with double simultaneous stimulation. There is no sensory dermatomal level identified. Motor: Strength is  5/5 in the bilateral upper and lower extremities.   Shoulder shrug is equal and symmetric.  There is no pronator drift. Deep tendon reflexes: Deep tendon reflexes are 2-/4 at the bilateral biceps, triceps, brachioradialis, patella and achilles. Plantar responses are downgoing bilaterally.  Movement examination: Tone: There is normal tone in the bilateral upper extremities.  The tone in the lower extremities is normal.  Abnormal movements: There is mod to severe dyskinesia, both axially and in the limbs Coordination:  There is mild decremation with RAM's, with any form of RAMS, including alternating supination and pronation of the forearm, hand opening and closing, finger taps, heel taps and toe taps. Gait and Station: The patient has difficulty arising out of a deep-seated chair without the use of the hands but she easily pushes off the chair. The patient's stride length is very normal with the use of a walker.  With her walker, she walks quickly down the hall.    Labs:  Her B12 on 04/02/2017 at [facility] was 290    ASSESSMENT/PLAN:  1.  Idiopathic Parkinson's disease.  The patient was dx in 1998.    -while patient has done incredibly well, she is bothered by on dyskinesia and off dystonia.  Amantadine has caused hallucinations in the past.  They have talked to her extensively about DBS therapy as well as duopa.  We addressed both of these topics here today.  She doesn't feel that she could handle mentally DBS and doesn't think that she could physically handle the duopa tube.  She has been previously given literature on both of these topics.  We talked about the pump patch system in trial.  We also discussed inbrija, which also isn't on the market yet.  -she is having trouble with increased dyskinesia at 9am/3pm.  We will just slightly drop the dosage.  she will take carbidopa/levodopa 25/100, 2 pills at 6 AM/1.5 tablets at 9 AM/2 tablets at noon/1.5 tablets at 3 PM and one pill at 5 PM prn.    -She  will continue carbidopa/levodopa 50/200 at bedtime.   -She will continue pramipexole 0.25  mg 3 times per day and 1 mg at bedtime  -We discussed that it used to be thought that levodopa would increase risk of melanoma but now it is believed that Parkinsons itself likely increases risk of melanoma. she is to get regular skin checks.  -We discussed community resources in the area including patient support groups and community exercise programs for PD and pt education was provided to the patient.  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  [facility] and is engaged there and receiving therapy there.  2.  B12 deficiency  -Injections were recommended at [facility].  Pt states that she didn't know about that.  We will just start them monthly.  Doesn't need to start them weekly since not too low.    3.  Follow up is anticipated in the next few months, sooner should new neurologic issues arise.  Much greater than 50% of this visit was spent in counseling and coordinating care.  Total face to face time:  60 min.  This did not include the 40 min of record review which was detailed above, which was non face to face time.   Cc:  Merlene LaughterStoneking, Hal, MD

## 2017-07-24 ENCOUNTER — Ambulatory Visit (INDEPENDENT_AMBULATORY_CARE_PROVIDER_SITE_OTHER): Payer: Medicare Other | Admitting: Neurology

## 2017-07-24 ENCOUNTER — Encounter: Payer: Self-pay | Admitting: Neurology

## 2017-07-24 VITALS — BP 124/66 | HR 84 | Ht 64.0 in | Wt 118.0 lb

## 2017-07-24 DIAGNOSIS — G20 Parkinson's disease: Secondary | ICD-10-CM

## 2017-07-24 DIAGNOSIS — E538 Deficiency of other specified B group vitamins: Secondary | ICD-10-CM | POA: Diagnosis not present

## 2017-07-24 DIAGNOSIS — G249 Dystonia, unspecified: Secondary | ICD-10-CM

## 2017-07-24 MED ORDER — CARBIDOPA-LEVODOPA 25-100 MG PO TABS: ORAL_TABLET | ORAL | 5 refills | Status: DC

## 2017-07-24 MED ORDER — CYANOCOBALAMIN 1000 MCG/ML IJ SOLN: 1000.0000 ug | INTRAMUSCULAR | 12 refills | Status: AC

## 2017-07-24 NOTE — Patient Instructions (Addendum)
1.  We will decrease your 9am and 3pm carbidopa/levodopa 25/100 to 1.5 tablets from 2 full tablets.  Let me know if this is a problem OR if you still have too many movements  2.  We will get you started with B12 injections on a monthly basis  3.  It was good to meet you!  4.  I will plan on seeing you in 3-4 months.

## 2017-08-25 ENCOUNTER — Telehealth: Payer: Self-pay | Admitting: Neurology

## 2017-08-25 MED ORDER — CARBIDOPA-LEVODOPA ER 50-200 MG PO TBCR: 1.0000 | EXTENDED_RELEASE_TABLET | Freq: Every day | ORAL | 1 refills | Status: AC

## 2017-08-25 MED ORDER — PRAMIPEXOLE DIHYDROCHLORIDE 1 MG PO TABS
1.0000 mg | ORAL_TABLET | Freq: Every day | ORAL | 1 refills | Status: DC
Start: 2017-08-25 — End: 2018-03-17

## 2017-08-25 MED ORDER — PRAMIPEXOLE DIHYDROCHLORIDE 0.25 MG PO TABS: ORAL_TABLET | ORAL | 1 refills | Status: DC

## 2017-08-25 NOTE — Telephone Encounter (Signed)
Patient states that since changing her Carbidopa Levodopa to 1.5 tablets at 9 am and 3 pm from 2 tablets that she is having increase in tremors. She would like to go back to 2 tablets. Please advise.

## 2017-08-25 NOTE — Telephone Encounter (Signed)
Pt called and wants to request a change of medication of the carbadopa

## 2017-08-25 NOTE — Telephone Encounter (Signed)
Pharmacy needs RX for Mirapex 1 mg tablets. No more refills from prior provider. Called in medications under Dr. Arbutus Leas since patient switched providers.

## 2017-08-25 NOTE — Telephone Encounter (Signed)
Nichole Krueger with [facility] left a message about a new prescription being called in for Pramipexole CB# [phone]

## 2017-08-26 MED ORDER — CARBIDOPA-LEVODOPA 25-100 MG PO TABS
ORAL_TABLET | ORAL | 5 refills | Status: AC
Start: 2017-08-26 — End: ?

## 2017-08-26 NOTE — Telephone Encounter (Signed)
Left message on machine for patient to call back.

## 2017-08-26 NOTE — Telephone Encounter (Signed)
I don't have a problem with that but make sure she isn't confusing tremor and dyskinesia terms.  I lowered it because she didn't like dyskinesia.

## 2017-08-26 NOTE — Telephone Encounter (Signed)
Spoke to patient and made aware of message from Dr. Arbutus Leas. She thinks she is having both tremor and dyskinesia. She would like to increase. I will send order to [facility].

## 2017-08-26 NOTE — Telephone Encounter (Signed)
Spoke with Claris Gower at [facility] and she asked me to send a new RX reflecting the dosage. RX sent.

## 2017-09-23 ENCOUNTER — Emergency Department (HOSPITAL_COMMUNITY)
Admission: EM | Admit: 2017-09-23 | Discharge: 2017-09-23 | Disposition: A | Discharge status: Home / Self Care | Payer: Medicare Other | Attending: Emergency Medicine | Admitting: Emergency Medicine

## 2017-09-23 ENCOUNTER — Emergency Department (HOSPITAL_COMMUNITY): Payer: Medicare Other

## 2017-09-23 ENCOUNTER — Encounter (HOSPITAL_COMMUNITY): Payer: Self-pay | Admitting: Emergency Medicine

## 2017-09-23 ENCOUNTER — Other Ambulatory Visit: Payer: Self-pay

## 2017-09-23 DIAGNOSIS — R0602 Shortness of breath: Secondary | ICD-10-CM | POA: Diagnosis present

## 2017-09-23 DIAGNOSIS — G20 Parkinson's disease: Secondary | ICD-10-CM | POA: Insufficient documentation

## 2017-09-23 DIAGNOSIS — R079 Chest pain, unspecified: Secondary | ICD-10-CM | POA: Diagnosis not present

## 2017-09-23 DIAGNOSIS — Z79899 Other long term (current) drug therapy: Secondary | ICD-10-CM | POA: Insufficient documentation

## 2017-09-23 DIAGNOSIS — Z7901 Long term (current) use of anticoagulants: Secondary | ICD-10-CM | POA: Diagnosis not present

## 2017-09-23 DIAGNOSIS — M629 Disorder of muscle, unspecified: Secondary | ICD-10-CM | POA: Diagnosis not present

## 2017-09-23 DIAGNOSIS — R29898 Other symptoms and signs involving the musculoskeletal system: Secondary | ICD-10-CM

## 2017-09-23 LAB — BASIC METABOLIC PANEL
ANION GAP: 7 (ref 5–15)
BUN: 16 mg/dL (ref 6–20)
CHLORIDE: 108 mmol/L (ref 101–111)
CO2: 27 mmol/L (ref 22–32)
Calcium: 9 mg/dL (ref 8.9–10.3)
Creatinine, Ser: 0.79 mg/dL (ref 0.44–1.00)
GFR calc Af Amer: >60 mL/min (ref >60)
Glucose, Bld: 155 mg/dL — ABNORMAL HIGH (ref 65–99)
POTASSIUM: 4 mmol/L (ref 3.5–5.1)
SODIUM: 142 mmol/L (ref 135–145)

## 2017-09-23 LAB — I-STAT TROPONIN, ED
TROPONIN I, POC: 0 ng/mL (ref 0.00–0.08)
TROPONIN I, POC: 0.01 ng/mL (ref 0.00–0.08)

## 2017-09-23 LAB — CBC WITH DIFFERENTIAL/PLATELET
Basophils Absolute: 0 10*3/uL (ref 0.0–0.1)
Basophils Relative: 1 %
EOS ABS: 0.1 10*3/uL (ref 0.0–0.7)
EOS PCT: 3 %
HCT: 32.6 % — ABNORMAL LOW (ref 36.0–46.0)
HEMOGLOBIN: 10.9 g/dL — AB (ref 12.0–15.0)
LYMPHS ABS: 0.9 10*3/uL (ref 0.7–4.0)
LYMPHS PCT: 26 %
MCH: 33 pg (ref 26.0–34.0)
MCHC: 33.4 g/dL (ref 30.0–36.0)
MCV: 98.8 fL (ref 78.0–100.0)
Monocytes Absolute: 0.3 10*3/uL (ref 0.1–1.0)
Monocytes Relative: 7 %
NEUTROS PCT: 63 %
Neutro Abs: 2.3 10*3/uL (ref 1.7–7.7)
PLATELETS: 195 10*3/uL (ref 150–400)
RBC: 3.3 MIL/uL — AB (ref 3.87–5.11)
RDW: 13.9 % (ref 11.5–15.5)
WBC: 3.6 10*3/uL — AB (ref 4.0–10.5)

## 2017-09-23 MED ORDER — ACETAMINOPHEN 325 MG PO TABS
650.0000 mg | ORAL_TABLET | Freq: Once | ORAL | Status: AC
  Administered 2017-09-23: 650 mg via ORAL
  Filled 2017-09-23: qty 2

## 2017-09-23 NOTE — ED Notes (Signed)
Patient transported to X-ray 

## 2017-09-23 NOTE — ED Provider Notes (Signed)
[facility] EMERGENCY DEPARTMENT Provider Note   CSN: [id number] Arrival date & time: 09/23/17  1932     History   Chief Complaint Chief Complaint  Patient presents with  . Shortness of Breath    HPI Nichole Krueger is a 81 y.o. female.  Patient is a 81 year old female with a history of paroxysmal atrial fibrillation, Parkinson's disease, syncope, PE on Eliquis who is presenting today with an episode of shortness of breath and chest and abdominal discomfort.  Patient states that she has had these episodes before.  She is actually had for the last year.  She had just finished eating dinner and was going to brush her teeth when she started feeling this discomfort in her abdomen that went up into her chest and then felt like a band around her upper abdomen.  She states at that time she loses muscle strength and her chin falls to her chest and she has difficulty breathing.  She has to hold up her head during these events until they resolve.  This 1 lasted approximately 2 hours but now she is feeling much better.  She states episodes in the past have lasted much longer.  She has no known history of heart disease and denies any recent surgery, leg swelling or pain.  She is still taking her anticoagulation.  She has had no recent illnesses.  She denied any choking on food with dinner tonight.   The history is provided by the patient.  Shortness of Breath  This is a recurrent problem. The average episode lasts 2 hours. The problem occurs continuously.The current episode started 3 to 5 hours ago. The problem has been resolved. Associated symptoms include chest pain and abdominal pain. Pertinent negatives include no fever, no rhinorrhea, no cough, no sputum production, no hemoptysis, no wheezing, no leg pain and no leg swelling. It is unknown what precipitated the problem. She has tried nothing for the symptoms. The treatment provided significant relief. Associated medical issues  include PE. Associated medical issues do not include COPD, CAD, heart failure, past MI or recent surgery.    Past Medical History:  Diagnosis Date  . Anxiety   . Atrial fibrillation (HCC)   . Macular degeneration   . Parkinson's disease (HCC)   . Pulmonary embolism [facility])     Patient Active Problem List   Diagnosis Date Noted  . Orthostatic syncope 05/06/2017  . Syncope 05/04/2017  . Muscle rigidity 01/31/2017  . AF (paroxysmal atrial fibrillation) (HCC) 01/31/2017  . Parkinson's disease (HCC) 01/31/2017    Past Surgical History:  Procedure Laterality Date  . BACK SURGERY  2017   x3  . CATARACT EXTRACTION Right   . EXTERNAL FIXATION ARM      OB History    No data available       Home Medications    Prior to Admission medications   Medication Sig Start Date End Date Taking? Authorizing Provider  ALPRAZolam (XANAX) 0.25 MG tablet Take 1 tablet (0.25 mg total) by mouth daily as needed for anxiety. 05/06/17   Joseph Art, DO  apixaban (ELIQUIS) 5 MG TABS tablet Take 5 mg by mouth 2 (two) times daily.    [provider]  baclofen (LIORESAL) 10 MG tablet Take 1 tablet (10 mg total) by mouth 3 (three) times daily as needed for muscle spasms (leg muscle spasms). 02/01/17   Rolly Salter, MD  carbidopa-levodopa (SINEMET CR) 50-200 MG tablet Take 1 tablet by mouth at bedtime. 08/25/17  Tat, Octaviano Battyebecca S, DO  carbidopa-levodopa (SINEMET IR) 25-100 MG tablet 2 tablets at 6 am, 2 tablets at 9 am, 2 tablets at 12 pm, 2 tablets at 3 pm, 1 PRN at 5 pm 08/26/17   Tat, Lurena Joinerebecca S, DO  cyanocobalamin (,VITAMIN B-12,) 1000 MCG/ML injection Inject 1 mL (1,000 mcg total) into the muscle every 30 (thirty) days. 07/24/17   Tat, Octaviano Battyebecca S, DO  moxifloxacin (VIGAMOX) 0.5 % ophthalmic solution 1 drop 3 (three) times daily.    [provider]  neomycin-polymyxin-dexameth (MAXITROL) 0.1 % OINT 1 application.    [provider]  pramipexole (MIRAPEX) 0.25 MG tablet TAKES 0.25  MG BY MOUTH THREE TIMES DAILY 08/25/17   Tat, Octaviano Battyebecca S, DO  pramipexole (MIRAPEX) 1 MG tablet Take 1 tablet (1 mg total) by mouth at bedtime. 08/25/17   Tat, Octaviano Battyebecca S, DO  prednisoLONE acetate (PRED FORTE) 1 % ophthalmic suspension 1 drop.    [provider]  selegiline (ELDEPRYL) 5 MG capsule TAKES 5MG  BY MOUTH TWICE DAILY 01/03/17   [provider]    Family History Family History  Problem Relation Age of Onset  . Stroke Mother   . Heart attack Father   . Heart attack Sister     Social History Social History   Tobacco Use  . Smoking status: Never Smoker  . Smokeless tobacco: Never Used  Substance Use Topics  . Alcohol use: Yes    Comment: occasional wine  . Drug use: No     Allergies   Other and Thorazine [chlorpromazine]   Review of Systems Review of Systems  Constitutional: Negative for fever.  HENT: Negative for rhinorrhea.   Respiratory: Positive for shortness of breath. Negative for cough, hemoptysis, sputum production and wheezing.   Cardiovascular: Positive for chest pain. Negative for leg swelling.  Gastrointestinal: Positive for abdominal pain.  All other systems reviewed and are negative.    Physical Exam Updated Vital Signs BP (!) 167/71   Pulse 81   Temp 98.4 F (36.9 C) (Oral)   Resp 18   SpO2 98%   Physical Exam  Constitutional: She is oriented to person, place, and time. She appears well-developed and well-nourished. No distress.  HENT:  Head: Normocephalic and atraumatic.  Mouth/Throat: Oropharynx is clear and moist.  Eyes: Conjunctivae and EOM are normal. Pupils are equal, round, and reactive to light.  Neck: Normal range of motion. Neck supple.  Cardiovascular: Normal rate, regular rhythm and intact distal pulses.  No murmur heard. Pulmonary/Chest: Effort normal and breath sounds normal. No respiratory distress. She has no wheezes. She has no rhonchi. She has no rales.  Abdominal: Soft. She exhibits no distension. There  is no tenderness. There is no rebound and no guarding.  Musculoskeletal: Normal range of motion. She exhibits no edema or tenderness.  Neurological: She is alert and oriented to person, place, and time.  Mild tremor at rest in the hands.  Mild weakness in neck muscles  Skin: Skin is warm and dry. No rash noted. No erythema.  Psychiatric: She has a normal mood and affect. Her behavior is normal.  Nursing note and vitals reviewed.    ED Treatments / Results  Labs (all labs ordered are listed, but only abnormal results are displayed) Labs Reviewed  CBC WITH DIFFERENTIAL/PLATELET - Abnormal; Notable for the following components:      Result Value   WBC 3.6 (*)    RBC 3.30 (*)    Hemoglobin 10.9 (*)    HCT  32.6 (*)    All other components within normal limits  BASIC METABOLIC PANEL - Abnormal; Notable for the following components:   Glucose, Bld 155 (*)    All other components within normal limits  I-STAT TROPONIN, ED  I-STAT TROPONIN, ED    EKG  EKG Interpretation  Date/Time:  Tuesday September 23 2017 19:47:44 EST Ventricular Rate:  84 PR Interval:  184 QRS Duration: 70 QT Interval:  350 QTC Calculation: 413 R Axis:   19 Text Interpretation:  Normal sinus rhythm Septal infarct , age undetermined No significant change since last tracing Confirmed by Gwyneth SproutPlunkett, Veronica Fretz ([id number]) on 09/23/2017 8:08:36 PM       Radiology Dg Chest 2 View  Result Date: 09/23/2017 CLINICAL DATA:  Acute lethargy and shortness of breath. Parkinson disease. EXAM: CHEST  2 VIEW COMPARISON:  01/31/2017 FINDINGS: Hyperinflation consistent with emphysema. No airspace disease or consolidation in the lungs. Heart size and pulmonary vascularity are normal. No blunting of costophrenic angles. No pneumothorax. Mediastinal contours appear intact. Postoperative posterior rod and screw fixation of the thoracolumbar spine. Multiple thoracic compression deformities, many with post kyphoplasty changes. IMPRESSION:  Emphysematous changes in the lungs. No evidence of active pulmonary disease. Electronically Signed   By: Burman NievesWilliam  Stevens M.D.   On: 09/23/2017 21:25    Procedures Procedures (including critical care time)  Medications Ordered in ED Medications  acetaminophen (TYLENOL) tablet 650 mg (650 mg Oral Given 09/23/17 2035)     Initial Impression / Assessment and Plan / ED Course  I have reviewed the triage vital signs and the nursing notes.  Pertinent labs & imaging results that were available during my care of the patient were reviewed by me and considered in my medical decision making (see chart for details).     Patient presenting with symptoms of what sounds like muscle rigidity causing her to feel short of breath because she cannot hold her head.  She states episode lasted approximately 2 hours but is improved now.  She is in no acute distress and satting normally.  Patient has had multiple episodes prior to this the most recent one was in March and April of this year.  Patient states she will get these when her medications need to be adjusted or up recently been adjusted.  She denies any infectious symptoms.  She has no focal neurologic abnormalities.  EKG shows no evidence of atrial fibrillation today.  Low suspicion that this is related to a PE.  Labs are reassuring and x-ray within normal limits.  Repeat troponin is also within normal limits and that is now within a 6-hour window of the events.  Patient has stated that she is noticed some more tremor than normal and was planning on calling Dr. Arbutus Leasat tomorrow for potential medication adjustment.  At this time feel that patient is safe for discharge back to her retirement community and follow-up with her doctor.  Final Clinical Impressions(s) / ED Diagnoses   Final diagnoses:  Muscle rigidity    ED Discharge Orders    None       Gwyneth SproutPlunkett, Julio Storr, MD 09/23/17 2248

## 2017-09-23 NOTE — ED Triage Notes (Addendum)
[facility] with complaints of acute SOB and lethargy. Pt denies any pain, A&Ox4. Pt has hx of Parkinson's and PE on eliquis. Pt on [place] 2L and feels less SOB.

## 2017-09-24 ENCOUNTER — Telehealth: Payer: Self-pay | Admitting: Neurology

## 2017-09-24 NOTE — Telephone Encounter (Signed)
Spoke with patient. She states she had an episode last night where her muscles got tight and she felt like she was having a hard time breathing. [facility] went to [facility]. She has these episodes maybe every 6 months or so. She isn't sure if her meds need adjusted. She really isn't wanting to change her meds, but states she will if Dr. Arbutus Leasat thinks it is necessary.  She also states she has some increase in both tremor and dyskinesia.  Dr. Arbutus Leasat please advise.

## 2017-09-24 NOTE — Telephone Encounter (Signed)
Patient called needing to speak with you regarding a question that she has.  Please Call. Thanks

## 2017-09-25 NOTE — Telephone Encounter (Signed)
Left message on machine for patient to call back.

## 2017-09-25 NOTE — Telephone Encounter (Signed)
I read her ER notes and it sounded nothing like PD.  PD doesn't cause shortness of breath.  Suggested work up by PCP

## 2017-09-25 NOTE — Telephone Encounter (Signed)
Patient called back and I made her aware.

## 2017-10-13 ENCOUNTER — Other Ambulatory Visit: Payer: Self-pay | Admitting: Neurology

## 2017-10-13 MED ORDER — SYRINGE (DISPOSABLE) 1 ML MISC: 0 refills | Status: DC

## 2017-10-13 MED ORDER — "NEEDLE (DISP) 22G X 1"" MISC": 1 refills | Status: DC

## 2017-10-13 NOTE — Telephone Encounter (Signed)
Received request for B12 supplies. Sent to pharmacy as requested.

## 2017-10-14 ENCOUNTER — Emergency Department (HOSPITAL_COMMUNITY): Payer: Medicare Other

## 2017-10-14 ENCOUNTER — Observation Stay (HOSPITAL_COMMUNITY)
Admission: EM | Admit: 2017-10-14 | Discharge: 2017-10-15 | Disposition: A | Discharge status: Skilled Nursing Facility | Payer: Medicare Other | Attending: Internal Medicine | Admitting: Internal Medicine

## 2017-10-14 ENCOUNTER — Encounter (HOSPITAL_COMMUNITY): Payer: Self-pay | Admitting: Emergency Medicine

## 2017-10-14 DIAGNOSIS — R7301 Impaired fasting glucose: Secondary | ICD-10-CM

## 2017-10-14 DIAGNOSIS — R29810 Facial weakness: Secondary | ICD-10-CM | POA: Diagnosis not present

## 2017-10-14 DIAGNOSIS — G20A1 Parkinson's disease without dyskinesia, without mention of fluctuations: Secondary | ICD-10-CM | POA: Diagnosis present

## 2017-10-14 DIAGNOSIS — D649 Anemia, unspecified: Secondary | ICD-10-CM | POA: Diagnosis not present

## 2017-10-14 DIAGNOSIS — R0789 Other chest pain: Principal | ICD-10-CM | POA: Insufficient documentation

## 2017-10-14 DIAGNOSIS — Z79899 Other long term (current) drug therapy: Secondary | ICD-10-CM | POA: Diagnosis not present

## 2017-10-14 DIAGNOSIS — E785 Hyperlipidemia, unspecified: Secondary | ICD-10-CM | POA: Diagnosis not present

## 2017-10-14 DIAGNOSIS — Z7901 Long term (current) use of anticoagulants: Secondary | ICD-10-CM | POA: Diagnosis not present

## 2017-10-14 DIAGNOSIS — I2721 Secondary pulmonary arterial hypertension: Secondary | ICD-10-CM

## 2017-10-14 DIAGNOSIS — Z8669 Personal history of other diseases of the nervous system and sense organs: Secondary | ICD-10-CM | POA: Diagnosis not present

## 2017-10-14 DIAGNOSIS — Z86711 Personal history of pulmonary embolism: Secondary | ICD-10-CM | POA: Insufficient documentation

## 2017-10-14 DIAGNOSIS — G20 Parkinson's disease: Secondary | ICD-10-CM | POA: Insufficient documentation

## 2017-10-14 DIAGNOSIS — R079 Chest pain, unspecified: Secondary | ICD-10-CM | POA: Diagnosis not present

## 2017-10-14 DIAGNOSIS — F419 Anxiety disorder, unspecified: Secondary | ICD-10-CM | POA: Insufficient documentation

## 2017-10-14 DIAGNOSIS — R7303 Prediabetes: Secondary | ICD-10-CM | POA: Diagnosis not present

## 2017-10-14 DIAGNOSIS — I480 Paroxysmal atrial fibrillation: Secondary | ICD-10-CM | POA: Diagnosis present

## 2017-10-14 DIAGNOSIS — R0602 Shortness of breath: Secondary | ICD-10-CM | POA: Insufficient documentation

## 2017-10-14 LAB — I-STAT TROPONIN, ED
TROPONIN I, POC: 0.01 ng/mL (ref 0.00–0.08)
TROPONIN I, POC: 0 ng/mL (ref 0.00–0.08)

## 2017-10-14 LAB — DIFFERENTIAL
BASOS ABS: 0 10*3/uL (ref 0.0–0.1)
BASOS PCT: 0 %
EOS ABS: 0.1 10*3/uL (ref 0.0–0.7)
Eosinophils Relative: 2 %
Lymphocytes Relative: 29 %
Lymphs Abs: 1 10*3/uL (ref 0.7–4.0)
MONO ABS: 0.2 10*3/uL (ref 0.1–1.0)
Monocytes Relative: 7 %
NEUTROS ABS: 2.2 10*3/uL (ref 1.7–7.7)
Neutrophils Relative %: 62 %

## 2017-10-14 LAB — URINALYSIS, ROUTINE W REFLEX MICROSCOPIC
Bilirubin Urine: NEGATIVE
Glucose, UA: NEGATIVE mg/dL
HGB URINE DIPSTICK: NEGATIVE
Ketones, ur: NEGATIVE mg/dL
Nitrite: NEGATIVE
PROTEIN: NEGATIVE mg/dL
SPECIFIC GRAVITY, URINE: 1.009 (ref 1.005–1.030)
pH: 8 (ref 5.0–8.0)

## 2017-10-14 LAB — CBC
HCT: 34.2 % — ABNORMAL LOW (ref 36.0–46.0)
Hemoglobin: 11.1 g/dL — ABNORMAL LOW (ref 12.0–15.0)
MCH: 32.7 pg (ref 26.0–34.0)
MCHC: 32.5 g/dL (ref 30.0–36.0)
MCV: 100.9 fL — AB (ref 78.0–100.0)
PLATELETS: 196 10*3/uL (ref 150–400)
RBC: 3.39 MIL/uL — ABNORMAL LOW (ref 3.87–5.11)
RDW: 14.3 % (ref 11.5–15.5)
WBC: 3.5 10*3/uL — AB (ref 4.0–10.5)

## 2017-10-14 LAB — COMPREHENSIVE METABOLIC PANEL
ANION GAP: 5 (ref 5–15)
AST: 17 U/L (ref 15–41)
Albumin: 4 g/dL (ref 3.5–5.0)
Alkaline Phosphatase: 86 U/L (ref 38–126)
BUN: 13 mg/dL (ref 6–20)
CHLORIDE: 107 mmol/L (ref 101–111)
CO2: 29 mmol/L (ref 22–32)
CREATININE: 0.63 mg/dL (ref 0.44–1.00)
Calcium: 9.2 mg/dL (ref 8.9–10.3)
Glucose, Bld: 130 mg/dL — ABNORMAL HIGH (ref 65–99)
POTASSIUM: 4.1 mmol/L (ref 3.5–5.1)
SODIUM: 141 mmol/L (ref 135–145)
Total Bilirubin: 0.4 mg/dL (ref 0.3–1.2)
Total Protein: 6.7 g/dL (ref 6.5–8.1)

## 2017-10-14 LAB — I-STAT CHEM 8, ED
BUN: 16 mg/dL (ref 6–20)
CHLORIDE: 102 mmol/L (ref 101–111)
Calcium, Ion: 1.14 mmol/L — ABNORMAL LOW (ref 1.15–1.40)
Creatinine, Ser: 0.6 mg/dL (ref 0.44–1.00)
Glucose, Bld: 129 mg/dL — ABNORMAL HIGH (ref 65–99)
HCT: 35 % — ABNORMAL LOW (ref 36.0–46.0)
HEMOGLOBIN: 11.9 g/dL — AB (ref 12.0–15.0)
POTASSIUM: 4.1 mmol/L (ref 3.5–5.1)
SODIUM: 141 mmol/L (ref 135–145)
TCO2: 27 mmol/L (ref 22–32)

## 2017-10-14 LAB — ETHANOL

## 2017-10-14 LAB — CBG MONITORING, ED: GLUCOSE-CAPILLARY: 125 mg/dL — AB (ref 65–99)

## 2017-10-14 LAB — APTT: APTT: 30 s (ref 24–36)

## 2017-10-14 LAB — RAPID URINE DRUG SCREEN, HOSP PERFORMED
AMPHETAMINES: NOT DETECTED
BENZODIAZEPINES: NOT DETECTED
Barbiturates: NOT DETECTED
COCAINE: NOT DETECTED
OPIATES: NOT DETECTED
Tetrahydrocannabinol: NOT DETECTED

## 2017-10-14 LAB — PROTIME-INR
INR: 1.13
PROTHROMBIN TIME: 14.4 s (ref 11.4–15.2)

## 2017-10-14 LAB — BRAIN NATRIURETIC PEPTIDE: B Natriuretic Peptide: 162.5 pg/mL — ABNORMAL HIGH (ref 0.0–100.0)

## 2017-10-14 LAB — D-DIMER, QUANTITATIVE: D-Dimer, Quant: 0.7 ug/mL-FEU — ABNORMAL HIGH (ref 0.00–0.50)

## 2017-10-14 MED ORDER — NITROGLYCERIN 0.4 MG SL SUBL: 0.4000 mg | SUBLINGUAL_TABLET | SUBLINGUAL | Status: DC | PRN

## 2017-10-14 MED ORDER — CARBIDOPA-LEVODOPA ER 25-100 MG PO TBCR
1.0000 | EXTENDED_RELEASE_TABLET | Freq: Every day | ORAL | Status: DC | PRN
  Filled 2017-10-14: qty 1

## 2017-10-14 MED ORDER — CARBIDOPA-LEVODOPA 25-250 MG PO TABS
2.0000 | ORAL_TABLET | Freq: Once | ORAL | Status: DC
  Filled 2017-10-14: qty 2

## 2017-10-14 MED ORDER — CARBIDOPA-LEVODOPA 25-100 MG PO TABS
2.0000 | ORAL_TABLET | Freq: Four times a day (QID) | ORAL | Status: DC
  Administered 2017-10-15 (×3): 2 via ORAL
  Filled 2017-10-14 (×5): qty 2

## 2017-10-14 MED ORDER — ACETAMINOPHEN 160 MG/5ML PO SOLN: 650.0000 mg | ORAL | Status: DC | PRN

## 2017-10-14 MED ORDER — ACETAMINOPHEN 650 MG RE SUPP: 650.0000 mg | RECTAL | Status: DC | PRN

## 2017-10-14 MED ORDER — GI COCKTAIL ~~LOC~~
30.0000 mL | Freq: Once | ORAL | Status: AC
  Administered 2017-10-14: 30 mL via ORAL
  Filled 2017-10-14: qty 30

## 2017-10-14 MED ORDER — NITROGLYCERIN 0.4 MG SL SUBL
0.4000 mg | SUBLINGUAL_TABLET | SUBLINGUAL | Status: DC | PRN
  Filled 2017-10-14: qty 1

## 2017-10-14 MED ORDER — CARBIDOPA-LEVODOPA 25-100 MG PO TABS
2.0000 | ORAL_TABLET | Freq: Once | ORAL | Status: AC
  Administered 2017-10-14: 2 via ORAL
  Filled 2017-10-14: qty 2

## 2017-10-14 MED ORDER — ACETAMINOPHEN 325 MG PO TABS: 650.0000 mg | ORAL_TABLET | ORAL | Status: DC | PRN

## 2017-10-14 NOTE — ED Triage Notes (Signed)
Per EMS pt from [facility], patient was walking normally and staff noticed at 1400 patient started leaning to L side and drooling. H/s of parkinson's and on eliquis. Pt a+Ox4 per baseline and c/o headache.

## 2017-10-14 NOTE — ED Notes (Signed)
No chest pain currently does not want nitro

## 2017-10-14 NOTE — ED Notes (Signed)
[facility] called @ 1713/Cancel Code Medical illustratortroke-per RN

## 2017-10-14 NOTE — ED Provider Notes (Signed)
[facility] Provider Note   CSN: [id number] Arrival date & time: 10/14/17  1630   An emergency department physician performed an initial assessment on this suspected stroke patient at 1630.  History   Chief Complaint Chief Complaint  Patient presents with  . Code Stroke    HPI Iver Nestleancy Troeger is a 81 y.o. female.  HPI   81 yo F with h/o Parkinson's, AFib, PE on Xarelto here with transient AMS. Arrives as code stroke. Pt reportedly was last normal around 2 PM today. She was sitting with staff at her SNF when she was noted to have increased L sided weakness and confusion. EMS called and pt brought here. Her weakness has improved en route. Currently, she denies any new weakness or numbness. No vision changes. She does note that she has had two similar episodes recently. She states that today, she felt a "rush" come over here, then felt like she wasn't getting enough air, followed by confusion and her noted weakness. She states sx are similar to her last episode. She now feels back to her baseline. No seizure like activity per report but pt was reportedly confused transiently. Denies med changes. She did miss her 3 PM dose of sinemet. No other complaints.  Of note, pt states these episodes have been increasingly frequent. She feels like she is getting more winded/exhausted earlier, sometimes with exertion. She admits to occasional chest pressure with this as well.  Past Medical History:  Diagnosis Date  . Anxiety   . Atrial fibrillation (HCC)   . Macular degeneration   . Parkinson's disease (HCC)   . Pulmonary embolism [facility](HCC)     Patient Active Problem List   Diagnosis Date Noted  . Orthostatic syncope 05/06/2017  . Syncope 05/04/2017  . Muscle rigidity 01/31/2017  . AF (paroxysmal atrial fibrillation) (HCC) 01/31/2017  . Parkinson's disease (HCC) 01/31/2017    Past Surgical History:  Procedure Laterality Date  . BACK SURGERY  2017   x3    . CATARACT EXTRACTION Right   . EXTERNAL FIXATION ARM      OB History    No data available       Home Medications    Prior to Admission medications   Medication Sig Start Date End Date Taking? Authorizing Provider  ALPRAZolam (XANAX) 0.25 MG tablet Take 1 tablet (0.25 mg total) by mouth daily as needed for anxiety. 05/06/17   Joseph ArtVann, Jessica U, DO  apixaban (ELIQUIS) 5 MG TABS tablet Take 5 mg by mouth 2 (two) times daily.    [provider]  baclofen (LIORESAL) 10 MG tablet Take 1 tablet (10 mg total) by mouth 3 (three) times daily as needed for muscle spasms (leg muscle spasms). 02/01/17   Rolly SalterPatel, Pranav M, MD  carbidopa-levodopa (SINEMET CR) 50-200 MG tablet Take 1 tablet by mouth at bedtime. 08/25/17   Tat, Octaviano Battyebecca S, DO  carbidopa-levodopa (SINEMET IR) 25-100 MG tablet 2 tablets at 6 am, 2 tablets at 9 am, 2 tablets at 12 pm, 2 tablets at 3 pm, 1 PRN at 5 pm 08/26/17   Tat, Octaviano Battyebecca S, DO  cyanocobalamin (,VITAMIN B-12,) 1000 MCG/ML injection Inject 1 mL (1,000 mcg total) into the muscle every 30 (thirty) days. 07/24/17   Tat, Octaviano Battyebecca S, DO  moxifloxacin (VIGAMOX) 0.5 % ophthalmic solution 1 drop 3 (three) times daily.    [provider]  NEEDLE, DISP, 22 G 22G X 1" MISC Use one monthly for B12 injections 10/13/17  Tat, Octaviano Battyebecca S, DO  neomycin-polymyxin-dexameth (MAXITROL) 0.1 % OINT 1 application.    [provider]  pramipexole (MIRAPEX) 0.25 MG tablet TAKES 0.25 MG BY MOUTH THREE TIMES DAILY 08/25/17   Tat, Octaviano Battyebecca S, DO  pramipexole (MIRAPEX) 1 MG tablet Take 1 tablet (1 mg total) by mouth at bedtime. 08/25/17   Tat, Octaviano Battyebecca S, DO  prednisoLONE acetate (PRED FORTE) 1 % ophthalmic suspension 1 drop.    [provider]  selegiline (ELDEPRYL) 5 MG capsule TAKES 5MG  BY MOUTH TWICE DAILY 01/03/17   [provider]  Syringe, Disposable, 1 ML MISC Use once monthly for b12 injections 10/13/17   Tat, Octaviano Battyebecca S, DO    Family History Family History   Problem Relation Age of Onset  . Stroke Mother   . Heart attack Father   . Heart attack Sister     Social History Social History   Tobacco Use  . Smoking status: Never Smoker  . Smokeless tobacco: Never Used  Substance Use Topics  . Alcohol use: Yes    Comment: occasional wine  . Drug use: No     Allergies   Other and Thorazine [chlorpromazine]   Review of Systems Review of Systems  Constitutional: Positive for fatigue.  Respiratory: Positive for shortness of breath.   Neurological: Positive for weakness.  All other systems reviewed and are negative.    Physical Exam Updated Vital Signs BP (!) 154/55   Pulse 81   Temp 98.4 F (36.9 C)   Resp 16   Ht 5\' 3"  (1.6 m)   Wt 55.9 kg (123 lb 3.8 oz)   SpO2 98%   BMI 21.83 kg/m   Physical Exam  Constitutional: She is oriented to person, place, and time. She appears well-developed and well-nourished. No distress.  HENT:  Head: Normocephalic and atraumatic.  Eyes: Conjunctivae are normal.  Neck: Neck supple.  Cardiovascular: Normal rate, regular rhythm and normal heart sounds. Exam reveals no friction rub.  No murmur heard. Pulmonary/Chest: Effort normal and breath sounds normal. No respiratory distress. She has no wheezes. She has no rales.  Rhonchi LLL  Abdominal: She exhibits no distension.  Musculoskeletal: She exhibits no edema.  Neurological: She is alert and oriented to person, place, and time.  Mild NLF flattening on right, CN otherwise intact. Strength 5/5 b/l UE and LE. Normal sensation to light touch. FTN/HTS impaired 2/2 Parkinson's. Cogwheeling on RUE>RLE, resting tremor noted as well worse on Right.  Skin: Skin is warm. Capillary refill takes less than 2 seconds.  Psychiatric: She has a normal mood and affect.  Nursing note and vitals reviewed.    ED Treatments / Results  Labs (all labs ordered are listed, but only abnormal results are displayed) Labs Reviewed  CBC - Abnormal; Notable for the  following components:      Result Value   WBC 3.5 (*)    RBC 3.39 (*)    Hemoglobin 11.1 (*)    HCT 34.2 (*)    MCV 100.9 (*)    All other components within normal limits  COMPREHENSIVE METABOLIC PANEL - Abnormal; Notable for the following components:   Glucose, Bld 130 (*)    ALT <5 (*)    All other components within normal limits  I-STAT CHEM 8, ED - Abnormal; Notable for the following components:   Glucose, Bld 129 (*)    Calcium, Ion 1.14 (*)    Hemoglobin 11.9 (*)    HCT 35.0 (*)    All other  components within normal limits  CBG MONITORING, ED - Abnormal; Notable for the following components:   Glucose-Capillary 125 (*)    All other components within normal limits  ETHANOL  PROTIME-INR  APTT  DIFFERENTIAL  RAPID URINE DRUG SCREEN, HOSP PERFORMED  URINALYSIS, ROUTINE W REFLEX MICROSCOPIC  D-DIMER, QUANTITATIVE (NOT AT [facility])  I-STAT TROPONIN, ED    EKG  EKG Interpretation None       Radiology Ct Head Code Stroke Wo Contrast  Result Date: 10/14/2017 CLINICAL DATA:  Code stroke. 81 year old female with right side facial weakness. EXAM: CT HEAD WITHOUT CONTRAST TECHNIQUE: Contiguous axial images were obtained from the base of the skull through the vertex without intravenous contrast. COMPARISON:  Head CTs without contrast January 28, 202018 and earlier. FINDINGS: Brain: Stable and normal for age cerebral volume. No midline shift, ventriculomegaly, mass effect, evidence of mass lesion, intracranial hemorrhage or evidence of cortically based acute infarction. Gray-white matter differentiation is within normal limits throughout the brain. Vascular: Calcified atherosclerosis at the skull base. No suspicious intracranial vascular hyperdensity. Skull:  Stable, negative. Sinuses/Orbits: Stable with mild maxillary and ethmoid mucosal thickening. Other: Interval postoperative changes to the right globe. Otherwise stable orbit and scalp soft tissues. ASPECTS ([place] Stroke Program Early CT  Score) - Ganglionic level infarction (caudate, lentiform nuclei, internal capsule, insula, M1-M3 cortex): 7 - Supraganglionic infarction (M4-M6 cortex): 3 Total score (0-10 with 10 being normal): 10 IMPRESSION: 1. Stable and normal for age noncontrast CT appearance of the brain. 2. ASPECTS is 10. 3. The above was relayed via text pager to Dr. Marthe Patch on 10/14/2017 at 16:52 . Electronically Signed   By: Odessa Fleming M.D.   On: 10/14/2017 16:53    Procedures Procedures (including critical care time)  Medications Ordered in ED Medications  carbidopa-levodopa (SINEMET IR) 25-250 MG per tablet immediate release 2 tablet (not administered)     Initial Impression / Assessment and Plan / ED Course  I have reviewed the triage vital signs and the nursing notes.  Pertinent labs & imaging results that were available during my care of the patient were reviewed by me and considered in my medical decision making (see chart for details).     81 yo F with PMHx as above here with SOB, transient AMS. Pt activated as code stroke but CT head neg, history is less consistent with this. [name] main complaint is sudden rushes of feeling SOB, with occasional associated chest pressure and sensation that she cannot catch her breath. These have been increasingly frequent, with exertion, for a few weeks. No chest pressure at this time. She does have h/o pAFib, strong family h/o CAD. No recent ischemic eval. Lab work reassuring. She has a h/o PE but is on Xarelto and age-adjusted D-Dimer is negative.  While in ED, pt developed SOB, CP. No EKG changes but given intermittent CP with HEART score of 4 (non-specific EKG changes, age, family history, moderate suspicious story), will observe.   Final Clinical Impressions(s) / ED Diagnoses   Final diagnoses:  None    ED Discharge Orders    None       Shaune Pollack, MD 10/14/17 2312

## 2017-10-14 NOTE — Consult Note (Signed)
Neurology Consultation  Reason for Consult: Code stroke for Right Facial Droop Referring Physician: Dr Erma HeritageIsaacs  CC: right sided facial droop  History is obtained from: EMS, Patient  HPI: Nichole Krueger is a 81 y.o. female PMH of parkinsons, Afib, PE on Eliquis, who was in her usual state of health till 1400h this afternoon, when she was noted to be walking abnormally. She uses a walker to walk at baseline  But her gait was off today. She also was noted to have right facial droop, which was new. EMS was called. The facial droop was present on EMS arrival but became progressively better en route to hospital. No other focal deficits were noted. At baseline, she has tremors involving right > left side. She reported feeling dyspneic prior to the staff noticing that her gait was off and continues to feel that she is short of breath. She has been on Eliquis for over a year, [place] done.  She has been on Eliquis ever since. She was also diagnosed on atrial fibrillation at some point. She denies any lateralized weakness at this time.  Denies any tingling numbness. She reports that she has not received her 3 PM dose of Sinemet at this time. The patient at a later time also mentioned that whenever she had this episode of facial droop, she did not remember the episode completely. Of note, her [place] on the outside evaluation on 07/24/2017 was 23/30  LKW: 1400h tpa given?: no, low NIH, probably not a stroke Premorbid modified Rankin scale (mRS): 3-4   ROS: A 14 point ROS was performed and is negative except as noted in the HPI.  Positive for shortness of breath Negative for chest pain Negative for fevers chills. Negative for visual symptoms. Positive for mild headache that started this afternoon.   Past Medical History:  Diagnosis Date  . Anxiety   . Atrial fibrillation (HCC)   . Macular degeneration   .  Parkinson's disease (HCC)   . Pulmonary embolism (HCC)     Family History  Problem Relation Age of Onset  . Stroke Mother   . Heart attack Father   . Heart attack Sister    Social History:   reports that  has never smoked. she has never used smokeless tobacco. She reports that she drinks alcohol. She reports that she does not use drugs.   Medications No current facility-administered medications for this encounter.   Current Outpatient Medications:  .  ALPRAZolam (XANAX) 0.25 MG tablet, Take 1 tablet (0.25 mg total) by mouth daily as needed for anxiety., Disp: 10 tablet, Rfl: 1 .  apixaban (ELIQUIS) 5 MG TABS tablet, Take 5 mg by mouth 2 (two) times daily., Disp: , Rfl:  .  baclofen (LIORESAL) 10 MG tablet, Take 1 tablet (10 mg total) by mouth 3 (three) times daily as needed for muscle spasms (leg muscle spasms)., Disp: 15 each, Rfl: 0 .  carbidopa-levodopa (SINEMET CR) 50-200 MG tablet, Take 1 tablet by mouth at bedtime., Disp: 90 tablet, Rfl: 1 .  carbidopa-levodopa (SINEMET IR) 25-100 MG tablet, 2 tablets at 6 am, 2 tablets at 9 am, 2 tablets at 12 pm, 2 tablets at 3 pm, 1 PRN at 5 pm, Disp: 270 tablet, Rfl: 5 .  cyanocobalamin (,VITAMIN B-12,) 1000 MCG/ML injection, Inject 1 mL (1,000 mcg total) into the muscle every 30 (thirty) days., Disp: 1 mL, Rfl: 12 .  moxifloxacin (VIGAMOX) 0.5 %  ophthalmic solution, 1 drop 3 (three) times daily., Disp: , Rfl:  .  NEEDLE, DISP, 22 G 22G X 1" MISC, Use one monthly for B12 injections, Disp: 10 each, Rfl: 1 .  neomycin-polymyxin-dexameth (MAXITROL) 0.1 % OINT, 1 application., Disp: , Rfl:  .  pramipexole (MIRAPEX) 0.25 MG tablet, TAKES 0.25 MG BY MOUTH THREE TIMES DAILY, Disp: 270 tablet, Rfl: 1 .  pramipexole (MIRAPEX) 1 MG tablet, Take 1 tablet (1 mg total) by mouth at bedtime., Disp: 90 tablet, Rfl: 1 .  prednisoLONE acetate (PRED FORTE) 1 % ophthalmic suspension, 1 drop., Disp: , Rfl:  .  selegiline (ELDEPRYL) 5 MG capsule, TAKES 5MG  BY MOUTH  TWICE DAILY, Disp: , Rfl: 1 .  Syringe, Disposable, 1 ML MISC, Use once monthly for b12 injections, Disp: 25 each, Rfl: 0  Exam: Current vital signs: BP (!) 154/55   Pulse 81   Temp 98.4 F (36.9 C) (Oral)   Resp 16   Ht 5\' 3"  (1.6 m)   Wt 55.9 kg (123 lb 3.8 oz)   SpO2 98%   BMI 21.83 kg/m   Vital signs in last 24 hours: Temp:  [98.4 F (36.9 C)] 98.4 F (36.9 C) (11/27 1652) Pulse Rate:  [81] 81 (11/27 1650) Resp:  [16] 16 (11/27 1650) BP: (154)/(55) 154/55 (11/27 1650) SpO2:  [98 %-99 %] 98 % (11/27 1650) Weight:  [55.9 kg (123 lb 3.8 oz)] 55.9 kg (123 lb 3.8 oz) (11/27 1652)  GENERAL: Awake, alert in NAD HEENT: - Normocephalic and atraumatic, dry mm, no LN++, no Thyromegally LUNGS - Clear to auscultation bilaterally with no wheezes CV - S1S2 RRR, no m/r/g, equal pulses bilaterally. ABDOMEN - Soft, nontender, nondistended with normoactive BS Ext: warm, well perfused, intact peripheral pulses,no edema NEURO:  Mental Status: AA&Ox3  Language: speech is non-dysarthric.  Naming, repetition, fluency, and comprehension intact. Cranial Nerves: PERRL. EOMI, visual fields full, no obvious facial asymmetry but there is very slight flattening of the right nasolabial fold, facial sensation intact, hearing intact, tongue/uvula/soft palate midline, normal sternocleidomastoid and trapezius muscle strength. No evidence of tongue atrophy or fibrillations Motor: Symmetric 5/5 all over.  Normal bulk.  Subtle increased tone in right upper extremity.  Moderate dyskinesia in both upper and lower extremities. Sensation- Intact to light touch bilaterally Coordination: No obvious dysmetria.  Some action tremor.  Decreased RAMS and bradykinesia both UE. Gait- deferred  Labs I have reviewed labs in epic and the results pertinent to this consultation are:  CBC    Component Value Date/Time   WBC 3.5 (L) 10/14/2017 1630   RBC 3.39 (L) 10/14/2017 1630   HGB 11.9 (L) 10/14/2017 1641   HCT 35.0  (L) 10/14/2017 1641   PLT 196 10/14/2017 1630   MCV 100.9 (H) 10/14/2017 1630   MCH 32.7 10/14/2017 1630   MCHC 32.5 10/14/2017 1630   RDW 14.3 10/14/2017 1630   LYMPHSABS 1.0 10/14/2017 1630   MONOABS 0.2 10/14/2017 1630   EOSABS 0.1 10/14/2017 1630   BASOSABS 0.0 10/14/2017 1630    CMP     Component Value Date/Time   NA 141 10/14/2017 1641   K 4.1 10/14/2017 1641   CL 102 10/14/2017 1641   CO2 27 09/23/2017 2001   GLUCOSE 129 (H) 10/14/2017 1641   BUN 16 10/14/2017 1641   CREATININE 0.60 10/14/2017 1641   CALCIUM 9.0 09/23/2017 2001   PROT 6.3 (L) 05/04/2017 0943   ALBUMIN 3.6 05/04/2017 0943   AST 21 05/04/2017 0943  ALT 12 (L) 05/04/2017 0943   ALKPHOS 85 05/04/2017 0943   BILITOT 0.7 05/04/2017 0943   GFRNONAA >60 09/23/2017 2001   GFRAA >60 09/23/2017 2001   An outpatient EEG in the chart from 05/05/2017 was normal.  IMAGING I have reviewed the images obtained: CT-scan of the brain -no acute changes.  Aspects 10.  Assessment:  81 year old woman past history of Parkinson's, A. fib and PE on Eliquis usual state of health until 2 PM this afternoon when she was noted to walking abnormally by the facility staff.  Also noted to have right-sided facial droop that has since near completely resolved. She reports having missed her 3 PM Sinemet dose because of this change that was noticed at around 230. Her exam essentially is unchanged from her last neurological exam documented by Dr. Arbutus Leasat. Complains of chest discomfort/shortness of breath which has been worsening over the past few weeks.  With her history of PE, I would recommend primary team for evaluation of PE. As far as her episode of right facial droop and gait disturbance, I would attribute that in large part to her Parkinson's and missing her dose which also worsens her moderate to severe dyskinesia. I do not find any other focal neurological deficits at this time.  Impression: Transient episode of facial droop Gait  disturbance History of Parkinson's Shortness of breath with a history of PE Memory problems  Recommendations: -I would recommend PE workup per primary team/ER -Neurologically, my suspicion for stroke is low.  If she ends up being admitted for full admission/observation, consider MRI brain. -As far as her Parkinson's medications are concerned, I would recommend maintaining her on her medications as documented on her chart currently, including Sinemet IR 25-100 2 tablets at 6 AM, 2 tablets at 9 AM 2 tablets at 12 PM 2 tablets at 3 PM and 1 tablet as needed at 5 PM.  Sinemet CR 50-200 mg tablet 1 tablet at bedtime daily. Mirapex 0.25 mg tablet by mouth 3 times daily, Mirapex 1 mg tablet by mouth at bedtime.  Selegiline 5 mg tablet twice daily. -Continue her apixaban for now. -Check B12 levels -Can consider an outpatient EEG although description of not remembering the episode of right facial droop does not sound highly suspicious for a seizure, and she has had an outpatient EEG a few months ago which was normal.  No further neurological recommendations at this time. Please  call neurology with further questions. -- Milon DikesAshish Punam Broussard, MD [facility] [phone] If 7pm to 7am, please call on call as listed on AMION.

## 2017-10-14 NOTE — ED Notes (Signed)
[name] CBG result was 125. Informed Nikki - RN.

## 2017-10-14 NOTE — ED Notes (Signed)
Called pharmacy x2 for sinemet

## 2017-10-14 NOTE — H&P (Signed)
History and Physical    Nichole Krueger:[id number]:[id number] DOB: 03/23/1936 DOA: 10/14/2017  PCP: Merlene LaughterStoneking, Hal, MD  Patient coming from: Skilled nursing facility.  Chief Complaint: Right facial droop.  Chest pain.  HPI: Nichole Nestleancy Hally is a 81 y.o. female with history of atrial fibrillation, pulmonary embolism, Parkinson's disease was noticed to have right facial droop and difficulty ambulating at around 2 PM today.  Patient at the same time also has some chest pressure with shortness of breath.  EMS was called and at the time rate patient droop was noticed.  Patient was brought to the ER.  Patient denies any focal weakness of the extremities.  ED Course: The ER patient was evaluated by the neurologist.  By the time patient arrived to the ER patient appears nonfocal.  CT head was unremarkable.  Patient's chest pressure improved without any intervention.  Neurology at this time feel that patient does not have any acute stroke but if admitted to get MRI brain.  Chest x-ray does not show any acute EKG shows normal sinus rhythm troponin was negative d-dimer was mildly elevated.  Review of Systems: As per HPI, rest all negative.   Past Medical History:  Diagnosis Date  . Anxiety   . Atrial fibrillation (HCC)   . Macular degeneration   . Parkinson's disease (HCC)   . Pulmonary embolism [facility](HCC)     Past Surgical History:  Procedure Laterality Date  . BACK SURGERY  2017   x3  . CATARACT EXTRACTION Right   . EXTERNAL FIXATION ARM       reports that  has never smoked. she has never used smokeless tobacco. She reports that she drinks alcohol. She reports that she does not use drugs.  Allergies  Allergen Reactions  . Other Other (See Comments)    ANESTHESIA-HALLUCINATIONS  . Thorazine [Chlorpromazine] Other (See Comments)    Unknown    Family History  Problem Relation Age of Onset  . Stroke Mother   . Heart attack Father   . Heart attack Sister     Prior to Admission medications     Medication Sig Start Date End Date Taking? Authorizing Provider  ALPRAZolam (XANAX) 0.25 MG tablet Take 1 tablet (0.25 mg total) by mouth daily as needed for anxiety. 05/06/17  Yes Joseph ArtVann, Jessica U, DO  apixaban (ELIQUIS) 5 MG TABS tablet Take 5 mg by mouth 2 (two) times daily.   Yes [provider]  baclofen (LIORESAL) 10 MG tablet Take 1 tablet (10 mg total) by mouth 3 (three) times daily as needed for muscle spasms (leg muscle spasms). Patient taking differently: Take 10 mg by mouth 3 (three) times daily.  02/01/17  Yes Rolly SalterPatel, Pranav M, MD  carbidopa-levodopa (SINEMET CR) 50-200 MG tablet Take 1 tablet by mouth at bedtime. 08/25/17  Yes Tat, Octaviano Battyebecca S, DO  carbidopa-levodopa (SINEMET IR) 25-100 MG tablet 2 tablets at 6 am, 2 tablets at 9 am, 2 tablets at 12 pm, 2 tablets at 3 pm, 1 PRN at 5 pm 08/26/17  Yes Tat, Lurena Joinerebecca S, DO  cyanocobalamin (,VITAMIN B-12,) 1000 MCG/ML injection Inject 1 mL (1,000 mcg total) into the muscle every 30 (thirty) days. 07/24/17  Yes Tat, Octaviano Battyebecca S, DO  moxifloxacin (VIGAMOX) 0.5 % ophthalmic solution 1 drop 3 (three) times daily.   Yes [provider]  Multiple Vitamin (MULTI-VITAMINS) TABS Take 1 tablet by mouth daily. 05/14/11  Yes [provider]  neomycin-polymyxin-dexameth (MAXITROL) 0.1 % OINT 1 application.   Yes [provider]  pramipexole (MIRAPEX) 0.25 MG tablet TAKES 0.25 MG BY MOUTH THREE TIMES DAILY 08/25/17  Yes Tat, Octaviano Battyebecca S, DO  pramipexole (MIRAPEX) 1 MG tablet Take 1 tablet (1 mg total) by mouth at bedtime. 08/25/17  Yes Tat, Octaviano Battyebecca S, DO  prednisoLONE acetate (PRED FORTE) 1 % ophthalmic suspension 1 drop.   Yes [provider]  selegiline (ELDEPRYL) 5 MG capsule TAKES 5MG  BY MOUTH TWICE DAILY 01/03/17  Yes [provider]  NEEDLE, DISP, 22 G 22G X 1" MISC Use one monthly for B12 injections 10/13/17   Tat, Octaviano Battyebecca S, DO  Syringe, Disposable, 1 ML MISC Use once monthly for b12 injections 10/13/17   TatOctaviano Batty,  Rebecca S, DO    Physical Exam: Vitals:   10/14/17 2200 10/14/17 2230 10/14/17 2245 10/14/17 2300  BP: (!) 129/59 125/63  (!) 151/64  Pulse: 68 65  69  Resp: 14 12 13    Temp:      TempSrc:      SpO2: 99% 98%  98%  Weight:      Height:          Constitutional: Moderately built and nourished. Vitals:   10/14/17 2200 10/14/17 2230 10/14/17 2245 10/14/17 2300  BP: (!) 129/59 125/63  (!) 151/64  Pulse: 68 65  69  Resp: 14 12 13    Temp:      TempSrc:      SpO2: 99% 98%  98%  Weight:      Height:       Eyes: Anicteric no pallor. ENMT: No discharge from the ears eyes nose or mouth. Neck: No JVD appreciated no mass felt. Respiratory: No rhonchi or crepitations. Cardiovascular: S1-S2 heard no murmurs appreciated. Abdomen: Soft nontender bowel sounds present. Musculoskeletal: No edema. Skin: No rash. Neurologic: Alert awake oriented to time place and person.  Moves all extremities.  Has tremors bilaterally.  No facial asymmetry.  Tongue is midline.  Pupils are equal reacting to light. Psychiatric: Appears normal.   Labs on Admission: I have personally reviewed following labs and imaging studies  CBC: Recent Labs  Lab 10/14/17 1630 10/14/17 1641  WBC 3.5*  --   NEUTROABS 2.2  --   HGB 11.1* 11.9*  HCT 34.2* 35.0*  MCV 100.9*  --   PLT 196  --    Basic Metabolic Panel: Recent Labs  Lab 10/14/17 1630 10/14/17 1641  NA 141 141  K 4.1 4.1  CL 107 102  CO2 29  --   GLUCOSE 130* 129*  BUN 13 16  CREATININE 0.63 0.60  CALCIUM 9.2  --    GFR: Estimated Creatinine Clearance: 47.2 mL/min (by C-G formula based on SCr of 0.6 mg/dL). Liver Function Tests: Recent Labs  Lab 10/14/17 1630  AST 17  ALT <5*  ALKPHOS 86  BILITOT 0.4  PROT 6.7  ALBUMIN 4.0   No results for input(s): LIPASE, AMYLASE in the last 168 hours. No results for input(s): AMMONIA in the last 168 hours. Coagulation Profile: Recent Labs  Lab 10/14/17 1630  INR 1.13   Cardiac  Enzymes: No results for input(s): CKTOTAL, CKMB, CKMBINDEX, TROPONINI in the last 168 hours. BNP (last 3 results) No results for input(s): PROBNP in the last 8760 hours. HbA1C: No results for input(s): HGBA1C in the last 72 hours. CBG: Recent Labs  Lab 10/14/17 1633  GLUCAP 125*   Lipid Profile: No results for input(s): CHOL, HDL, LDLCALC, TRIG, CHOLHDL, LDLDIRECT in the last 72 hours. Thyroid Function Tests: No results  for input(s): TSH, T4TOTAL, FREET4, T3FREE, THYROIDAB in the last 72 hours. Anemia Panel: No results for input(s): VITAMINB12, FOLATE, FERRITIN, TIBC, IRON, RETICCTPCT in the last 72 hours. Urine analysis:    Component Value Date/Time   COLORURINE STRAW (A) 10/14/2017 1834   APPEARANCEUR CLEAR 10/14/2017 1834   LABSPEC 1.009 10/14/2017 1834   PHURINE 8.0 10/14/2017 1834   GLUCOSEU NEGATIVE 10/14/2017 1834   HGBUR NEGATIVE 10/14/2017 1834   BILIRUBINUR NEGATIVE 10/14/2017 1834   KETONESUR NEGATIVE 10/14/2017 1834   PROTEINUR NEGATIVE 10/14/2017 1834   NITRITE NEGATIVE 10/14/2017 1834   LEUKOCYTESUR SMALL (A) 10/14/2017 1834   Sepsis Labs: @LABRCNTIP (procalcitonin:4,lacticidven:4) )No results found for this or any previous visit (from the past 240 hour(s)).   Radiological Exams on Admission: Dg Chest 2 View  Result Date: 10/14/2017 CLINICAL DATA:  Acute shortness of breath. EXAM: CHEST  2 VIEW COMPARISON:  09/23/2017 and 01/31/2017 radiographs FINDINGS: The cardiomediastinal silhouette is unremarkable. There is no evidence of focal airspace disease, pulmonary edema, suspicious pulmonary nodule/mass, pleural effusion, or pneumothorax. No acute bony abnormalities are identified. Posterior fusion rods and pedicular screws within thoracic and lumbar spine again noted with multiple areas of vertebral augmentation. IMPRESSION: No active cardiopulmonary disease. Electronically Signed   By: Harmon Pier M.D.   On: 10/14/2017 18:27   Ct Head Code Stroke Wo  Contrast  Result Date: 10/14/2017 CLINICAL DATA:  Code stroke. 81 year old female with right side facial weakness. EXAM: CT HEAD WITHOUT CONTRAST TECHNIQUE: Contiguous axial images were obtained from the base of the skull through the vertex without intravenous contrast. COMPARISON:  Head CTs without contrast Jun 29, 202018 and earlier. FINDINGS: Brain: Stable and normal for age cerebral volume. No midline shift, ventriculomegaly, mass effect, evidence of mass lesion, intracranial hemorrhage or evidence of cortically based acute infarction. Gray-white matter differentiation is within normal limits throughout the brain. Vascular: Calcified atherosclerosis at the skull base. No suspicious intracranial vascular hyperdensity. Skull:  Stable, negative. Sinuses/Orbits: Stable with mild maxillary and ethmoid mucosal thickening. Other: Interval postoperative changes to the right globe. Otherwise stable orbit and scalp soft tissues. ASPECTS (Alberta Stroke Program Early CT Score) - Ganglionic level infarction (caudate, lentiform nuclei, internal capsule, insula, M1-M3 cortex): 7 - Supraganglionic infarction (M4-M6 cortex): 3 Total score (0-10 with 10 being normal): 10 IMPRESSION: 1. Stable and normal for age noncontrast CT appearance of the brain. 2. ASPECTS is 10. 3. The above was relayed via text pager to Dr. Marthe Patch on 10/14/2017 at 16:52 . Electronically Signed   By: Odessa Fleming M.D.   On: 10/14/2017 16:53    EKG: Independently reviewed.  Normal sinus rhythm with possible LVH.  Assessment/Plan Principal Problem:   Chest pain Active Problems:   AF (paroxysmal atrial fibrillation) (HCC)   Parkinson's disease (HCC)    1. Chest pain -patient chest pain improved without any intervention.  Patient has no history of PE.  Plan to get a CT angiogram of the chest to rule out any new pulmonary embolism despite being on apixaban.  Patient likes to get it done once patient has gone to the regular room from the ER.  Will cycle  cardiac markers check 2D echo.  Patient is chest pain-free at this time. 2. Transient right facial droop and difficulty walking with history of Parkinson's disease -neurologist has recommended MRI brain and B12 levels to be checked along with EEG.  Get physical therapy consult and continue patient's Parkinson's disease medications which I have gone through with pharmacist and continued as given.  3. Atrial fibrillation -presently in sinus rhythm.  Chads 2 vasc score is more than 2.  On apixaban.   DVT prophylaxis: Apixaban. Code Status: Full code. Family Communication: Patient's sister. Disposition Plan: Back to the facility. Consults called: Neurologist. Admission status: Observation.   Eduard Clos MD [facility] Pager [phone].  If 7PM-7AM, please contact night-coverage [email or web address] Password [email or web address]  10/14/2017, 11:10 PM

## 2017-10-15 ENCOUNTER — Observation Stay (HOSPITAL_BASED_OUTPATIENT_CLINIC_OR_DEPARTMENT_OTHER): Payer: Medicare Other

## 2017-10-15 ENCOUNTER — Observation Stay (HOSPITAL_COMMUNITY): Payer: Medicare Other

## 2017-10-15 ENCOUNTER — Ambulatory Visit (HOSPITAL_COMMUNITY): Payer: Medicare Other

## 2017-10-15 DIAGNOSIS — Z8669 Personal history of other diseases of the nervous system and sense organs: Secondary | ICD-10-CM | POA: Diagnosis not present

## 2017-10-15 DIAGNOSIS — I2721 Secondary pulmonary arterial hypertension: Secondary | ICD-10-CM

## 2017-10-15 DIAGNOSIS — D649 Anemia, unspecified: Secondary | ICD-10-CM | POA: Diagnosis not present

## 2017-10-15 DIAGNOSIS — R7301 Impaired fasting glucose: Secondary | ICD-10-CM

## 2017-10-15 DIAGNOSIS — R079 Chest pain, unspecified: Secondary | ICD-10-CM

## 2017-10-15 DIAGNOSIS — G20 Parkinson's disease: Secondary | ICD-10-CM | POA: Diagnosis not present

## 2017-10-15 DIAGNOSIS — I480 Paroxysmal atrial fibrillation: Secondary | ICD-10-CM | POA: Diagnosis not present

## 2017-10-15 LAB — COMPREHENSIVE METABOLIC PANEL
ALT: <5 U/L — ABNORMAL LOW (ref 14–54)
AST: 15 U/L (ref 15–41)
Albumin: 3.4 g/dL — ABNORMAL LOW (ref 3.5–5.0)
Alkaline Phosphatase: 77 U/L (ref 38–126)
Anion gap: 7 (ref 5–15)
BILIRUBIN TOTAL: 0.6 mg/dL (ref 0.3–1.2)
BUN: 13 mg/dL (ref 6–20)
CO2: 25 mmol/L (ref 22–32)
CREATININE: 0.66 mg/dL (ref 0.44–1.00)
Calcium: 8.7 mg/dL — ABNORMAL LOW (ref 8.9–10.3)
Chloride: 107 mmol/L (ref 101–111)
Glucose, Bld: 122 mg/dL — ABNORMAL HIGH (ref 65–99)
POTASSIUM: 4 mmol/L (ref 3.5–5.1)
Sodium: 139 mmol/L (ref 135–145)
TOTAL PROTEIN: 5.9 g/dL — AB (ref 6.5–8.1)

## 2017-10-15 LAB — CBC
HEMATOCRIT: 34.8 % — AB (ref 36.0–46.0)
Hemoglobin: 11.4 g/dL — ABNORMAL LOW (ref 12.0–15.0)
MCH: 32.8 pg (ref 26.0–34.0)
MCHC: 32.8 g/dL (ref 30.0–36.0)
MCV: 100 fL (ref 78.0–100.0)
Platelets: 220 10*3/uL (ref 150–400)
RBC: 3.48 MIL/uL — ABNORMAL LOW (ref 3.87–5.11)
RDW: 14.2 % (ref 11.5–15.5)
WBC: 3.7 10*3/uL — AB (ref 4.0–10.5)

## 2017-10-15 LAB — LIPID PANEL
CHOLESTEROL: 189 mg/dL (ref 0–200)
HDL: 76 mg/dL (ref >40)
LDL Cholesterol: 103 mg/dL — ABNORMAL HIGH (ref 0–99)
TRIGLYCERIDES: 50 mg/dL (ref <150)
Total CHOL/HDL Ratio: 2.5 RATIO
VLDL: 10 mg/dL (ref 0–40)

## 2017-10-15 LAB — ECHOCARDIOGRAM COMPLETE
Height: 63 in
Weight: 1971.79 oz

## 2017-10-15 LAB — TROPONIN I

## 2017-10-15 LAB — VITAMIN B12: VITAMIN B 12: 376 pg/mL (ref 180–914)

## 2017-10-15 LAB — HEMOGLOBIN A1C
HEMOGLOBIN A1C: 6 % — AB (ref 4.8–5.6)
Mean Plasma Glucose: 125.5 mg/dL

## 2017-10-15 MED ORDER — IOPAMIDOL (ISOVUE-370) INJECTION 76%
INTRAVENOUS | Status: AC
  Administered 2017-10-15: 100 mL via INTRAVENOUS
  Filled 2017-10-15: qty 100

## 2017-10-15 MED ORDER — ALPRAZOLAM 0.25 MG PO TABS: 0.2500 mg | ORAL_TABLET | Freq: Every day | ORAL | 0 refills | Status: AC | PRN

## 2017-10-15 MED ORDER — ALPRAZOLAM 0.25 MG PO TABS: 0.2500 mg | ORAL_TABLET | Freq: Every day | ORAL | 0 refills | Status: DC | PRN

## 2017-10-15 MED ORDER — CARBIDOPA-LEVODOPA 25-100 MG PO TABS
1.0000 | ORAL_TABLET | Freq: Every day | ORAL | Status: DC | PRN
  Administered 2017-10-15: 1 via ORAL
  Filled 2017-10-15 (×2): qty 1

## 2017-10-15 NOTE — ED Notes (Signed)
Pt has been stating phrases alluding to her not being able to make it much longer. Pt whispered that she can't fight Parkinson's anymore and has been requesting numbers of friends & family to be called because she doesn't want to die alone. Pt resting in bed at this time and requests for lights to be left on. VS Stable.

## 2017-10-15 NOTE — Progress Notes (Signed)
  Echocardiogram 2D Echocardiogram has been performed.  Janalyn HarderWest, Altagracia Rone R 10/15/2017, 11:18 AM

## 2017-10-15 NOTE — ED Notes (Signed)
Phone #s: Pt reports her medical surrogate, Pilar PlateLinda Oelschlaeger [phone]. Guy FrancoFriend, Connie: [phone]).   Thayer OhmChris (son) [phone]

## 2017-10-15 NOTE — ED Notes (Signed)
Patient transported to MRI 

## 2017-10-15 NOTE — ED Notes (Signed)
Pt reports medication not relieving her parkinson sx enough. Pt requested to come back to room and stated that the MRI would have to be done later.

## 2017-10-15 NOTE — Progress Notes (Signed)
Subjective: Very anxious about receiving her medications. Yesterday she did not receive her medications at the times she takes them at home and she is on a very regimented schedule. At this time orders are to give her medications as the correct times.   Exam: Vitals:   10/15/17 0730 10/15/17 0800  BP: 131/66 (!) 147/63  Pulse: 66 70  Resp: 17 12  Temp:    SpO2: 99% 99%    HEENT-  Normocephalic, no lesions, without obvious abnormality.  Normal external eye and conjunctiva.  Normal TM's bilaterally.  Normal auditory canals and external ears. Normal external nose, mucus membranes and septum.  Normal pharynx. Cardiovascular- S1, S2 normal, pulses palpable throughout   Lungs- chest clear, no wheezing, rales, normal symmetric air entry Abdomen- normal findings: bowel sounds normal Extremities- no edema  Neuro:  CN: Pupils are equal and round. They are symmetrically reactive from 3-->2 mm. EOMI without nystagmus. Facial sensation is intact to light touch. Face right  Flatting of nasolabial at rest with normal strength and mobility. Hearing is intact to conversational voice. Palate elevates symmetrically and uvula is midline. Voice is normal in tone, pitch and quality. Bilateral SCM and trapezii are 5/5. Tongue is midline with normal bulk and mobility.  Motor: strength. 5/5 throughout.tremor of right leg at rest, left foot has increased tone at ankle. Bradykinesia bilateral arms.   Sensation: Intact to light touch.  DTRs: 2+, symmetric  Toes downgoing bilaterally. No pathologic reflexes.  Coordination:  No obvious dysmetria  Medications:  Current Facility-Administered Medications  Medication Dose Route Frequency Provider Last Rate Last Dose  . acetaminophen (TYLENOL) tablet 650 mg  650 mg Oral Q4H PRN Eduard Clos, MD       Or  . acetaminophen (TYLENOL) solution 650 mg  650 mg Per Tube Q4H PRN Eduard Clos, MD       Or  . acetaminophen (TYLENOL) suppository 650 mg  650 mg  Rectal Q4H PRN Eduard Clos, MD      . carbidopa-levodopa (SINEMET IR) 25-100 MG per tablet immediate release 1 tablet  1 tablet Oral Daily PRN Eduard Clos, MD   1 tablet at 10/15/17 0105  . carbidopa-levodopa (SINEMET IR) 25-100 MG per tablet immediate release 2 tablet  2 tablet Oral QID Eduard Clos, MD   2 tablet at 10/15/17 0555  . nitroGLYCERIN (NITROSTAT) SL tablet 0.4 mg  0.4 mg Sublingual Q5 min PRN Shaune Pollack, MD       Current Outpatient Medications  Medication Sig Dispense Refill  . ALPRAZolam (XANAX) 0.25 MG tablet Take 1 tablet (0.25 mg total) by mouth daily as needed for anxiety. 10 tablet 1  . apixaban (ELIQUIS) 5 MG TABS tablet Take 5 mg by mouth 2 (two) times daily.    . baclofen (LIORESAL) 10 MG tablet Take 1 tablet (10 mg total) by mouth 3 (three) times daily as needed for muscle spasms (leg muscle spasms). (Patient taking differently: Take 10 mg by mouth 3 (three) times daily. ) 15 each 0  . carbidopa-levodopa (SINEMET CR) 50-200 MG tablet Take 1 tablet by mouth at bedtime. 90 tablet 1  . carbidopa-levodopa (SINEMET IR) 25-100 MG tablet 2 tablets at 6 am, 2 tablets at 9 am, 2 tablets at 12 pm, 2 tablets at 3 pm, 1 PRN at 5 pm 270 tablet 5  . cyanocobalamin (,VITAMIN B-12,) 1000 MCG/ML injection Inject 1 mL (1,000 mcg total) into the muscle every 30 (thirty) days. 1 mL 12  .  moxifloxacin (VIGAMOX) 0.5 % ophthalmic solution 1 drop 3 (three) times daily.    . Multiple Vitamin (MULTI-VITAMINS) TABS Take 1 tablet by mouth daily.    Marland Kitchen. neomycin-polymyxin-dexameth (MAXITROL) 0.1 % OINT 1 application.    . pramipexole (MIRAPEX) 0.25 MG tablet TAKES 0.25 MG BY MOUTH THREE TIMES DAILY 270 tablet 1  . pramipexole (MIRAPEX) 1 MG tablet Take 1 tablet (1 mg total) by mouth at bedtime. 90 tablet 1  . prednisoLONE acetate (PRED FORTE) 1 % ophthalmic suspension 1 drop.    . selegiline (ELDEPRYL) 5 MG capsule TAKES 5MG  BY MOUTH TWICE DAILY  1  . NEEDLE, DISP, 22 G  22G X 1" MISC Use one monthly for B12 injections 10 each 1  . Syringe, Disposable, 1 ML MISC Use once monthly for b12 injections 25 each 0     Pertinent Labs/Diagnostics:   Dg Chest 2 View  Result Date: 10/14/2017 CLINICAL DATA:  Acute shortness of breath. EXAM: CHEST  2 VIEW COMPARISON:  09/23/2017 and 01/31/2017 radiographs FINDINGS: The cardiomediastinal silhouette is unremarkable. There is no evidence of focal airspace disease, pulmonary edema, suspicious pulmonary nodule/mass, pleural effusion, or pneumothorax. No acute bony abnormalities are identified. Posterior fusion rods and pedicular screws within thoracic and lumbar spine again noted with multiple areas of vertebral augmentation. IMPRESSION: No active cardiopulmonary disease. Electronically Signed   By: Harmon PierJeffrey  Hu M.D.   On: 10/14/2017 18:27   Ct Head Code Stroke Wo Contrast  Result Date: 10/14/2017 CLINICAL DATA:  Code stroke. 81 year old female with right side facial weakness. EXAM: CT HEAD WITHOUT CONTRAST TECHNIQUE: Contiguous axial images were obtained from the base of the skull through the vertex without intravenous contrast. COMPARISON:  Head CTs without contrast 03/19/202018 and earlier. FINDINGS: Brain: Stable and normal for age cerebral volume. No midline shift, ventriculomegaly, mass effect, evidence of mass lesion, intracranial hemorrhage or evidence of cortically based acute infarction. Gray-white matter differentiation is within normal limits throughout the brain. Vascular: Calcified atherosclerosis at the skull base. No suspicious intracranial vascular hyperdensity. Skull:  Stable, negative. Sinuses/Orbits: Stable with mild maxillary and ethmoid mucosal thickening. Other: Interval postoperative changes to the right globe. Otherwise stable orbit and scalp soft tissues. ASPECTS (Alberta Stroke Program Early CT Score) - Ganglionic level infarction (caudate, lentiform nuclei, internal capsule, insula, M1-M3 cortex): 7 -  Supraganglionic infarction (M4-M6 cortex): 3 Total score (0-10 with 10 being normal): 10 IMPRESSION: 1. Stable and normal for age noncontrast CT appearance of the brain. 2. ASPECTS is 10. 3. The above was relayed via text pager to Dr. Marthe PatchA. Mykeal Carrick on 10/14/2017 at 16:52 . Electronically Signed   By: Odessa FlemingH  Hall M.D.   On: 10/14/2017 16:53     Felicie MornDavid Smith PA-C [facility] [phone]  Impression: Transient episode of facial droop Gait disturbance History of Parkinson's Shortness of breath with a history of PE Memory problems  Recommendations: -I would recommend PE workup per primary team/ER -Neurologically, my suspicion for stroke is low. MRI was attempted but could not tolerate -As far as her Parkinson's medications are concerned, I would recommend maintaining her on her medications as documented on her chart currently, including Sinemet IR 25-100 2 tablets at 6 AM, 2 tablets at 9 AM 2 tablets at 12 PM 2 tablets at 3 PM and 1 tablet as needed at 5 PM.  Sinemet CR 50-200 mg tablet 1 tablet at bedtime daily. Mirapex 0.25 mg tablet by mouth 3 times daily, Mirapex 1 mg tablet by mouth at bedtime.  Selegiline  5 mg tablet twice daily. -Continue her apixaban for now. - B12 levels >500 as her levels ar in the 300's -both  EEG and MRI may be done as out patient if out patient neurologist feels needs to be done.  No further neurological recommendations at this time.    10/15/2017, 9:12 AM   Attending Neurohospitalist Addendum Patient seen and examined. Agree with the history and physical as documented above. Agree with the plan as documented, which I helped formulate. I have independently obtaining history, and review of systems on my exam and the patient and reviewed the chart including pertinent labs and pertinent imaging.  Plan was communicated to primary hospitalist Dr Marland McalpineSheikh over the phone.  --- Milon DikesAshish Rahma Meller, MD [facility] [phone]  If 7pm to 7am, please  call on call as listed on AMION.

## 2017-10-15 NOTE — Discharge Summary (Signed)
Physician Discharge Summary  Nichole Krueger ZOX:[id number] DOB: [birth year] DOA: 10/14/2017  PCP: [name], MD  Admit date: 10/14/2017 Discharge date: 10/15/2017  Admitted From: SNF Disposition:  SNF  Recommendations for Outpatient Follow-up:  1. Follow up with PCP in 1-2 weeks 2. Follow up with Neurology as an outpatient; Defer to Outpatient Neurologist to obtain EEG and MRI if deemed Necessary 3. Follow up with Cardiology and Pulmonary as an outpatient  4. Please obtain CMP/CBC, Mag, Phos in one week 5. Please follow up on the following pending results:  Home Health: No  Equipment/Devices: None  Discharge Condition: Stable  CODE STATUS: FULL CODE Diet recommendation: Heart Healthy Diet  Brief/Interim Summary: The patient is a 81 y.o. female with history of Atrial Fibrillation, pulmonary embolism, Parkinson's Disease, and other comorbids who was noticed to have right facial droop and difficulty ambulating at around 2 PM today. Patient at the same time also has some chest pressure with shortness of breath.  EMS was called and at the time rate patient droop was noticed.  Patient was brought to the ER.  Patient denies any focal weakness of the extremities.  The ER patient was evaluated by the neurologist.  By the time patient arrived to the ER patient appears nonfocal.  CT head was unremarkable. Patient's chest pressure improved without any intervention.  Neurology at that time felt that patient did not have any acute stroke but if admitted to get MRI brain. MRI of brain was attempted but unable to be done so Neurology cancelled it and recommended it being done as an outpatient Chest x-ray did not show any acute cardiopulmonary disease and EKG shows normal sinus rhythm. Troponin was negative but d-dimer was mildly elevated so CTA was done despite patient being on Anticoagulation and showed no PE. ECHOCardiogram was done too and was essentially normal. Patient's symptoms improved and was  deemed medically stable to be D/C'd back to SNF as she was cleared by Neurology and Chest Pain was not indicative of ACS.   Discharge Diagnoses:  Principal Problem:   Chest pain Active Problems:   AF (paroxysmal atrial fibrillation) (HCC)   Parkinson's disease (HCC)   Impaired fasting glucose   Normocytic anemia   Pulmonary arterial hypertension (HCC)  Chest pain r/o'd ACS -Patient chest pain improved without any intervention.  -Patient has no history of PE. Obtained CT Angiogram of the chest to rule out any new pulmonary embolism despite being on apixaban; No evidence of Acute Pulmonary Embolism. There was a small amount of material in the right upper and right lower lobe pulmonary arteries likely represents cement related to prior kyphoplasties. This finding is chronic, and was seen on prior chest x-ray from March 2018.  -ECHOCardiogram obtained and EVF was 55-60% with normal Wall motion and Left Ventricular Diastolic Function -Troponin 0.00 and repeat <0.03 -Doubt ACS and not Pleuritic. Likely from Anxiety -Follow up with Cardiology as an outpatient.  -EKG showed Normal Sinus Rhythm  Transient right facial droop and Gait Disturbances with history of Parkinson's Disease, improved  -Neurology Evaluated and feel like suspicion from CVA is low -Head CT  -Neurologist recommended MRI brain but patient unable to tolerate it so Neurology cancelled it and is deferring to it as an outpatient if Primary  Neurologist feels like it is Necessary -C/w Home Sinemet Regimen, Mirapex Regimen, Baclofen, and Selegiline -C/w Apixaban for now -C/w B12 Supplementation as an outpatient -Outpatient Neurology Follow up  Paroxysmal Atrial Fibrillation -Presently in sinus rhythm.  -Chads 2 vasc  score is more than 2.  -C/w Apixaban.  IFG/Pre-Diabetes -Blood Sugard on CMP was 122 -Patient's HbA1c was 6.0 -C/w Diet Control -Follow up with PCP as an outpatient   Mild Hyperlipidemia  -Patient's LDL  was 103 -C/w Diet Control and follow up with outpatient PCP  Normocytic Anemia  -Patient's Hb/Hct went from 11.9/35.0 -> 11.4/34.8  -Continue to Monitor for S/Sx of Bleeding as patient is on Anticoagulation -Repeat CBC as an out patient   Pulmonary Arterial Hypertension -ECHO Done as below -Evaluation and Follow up with outpatient Pulmonary and Cardiology  Discharge Instructions Discharge Instructions    Call MD for:  difficulty breathing, headache or visual disturbances   Complete by:  As directed    Call MD for:  extreme fatigue   Complete by:  As directed    Call MD for:  hives   Complete by:  As directed    Call MD for:  persistant dizziness or light-headedness   Complete by:  As directed    Call MD for:  persistant nausea and vomiting   Complete by:  As directed    Call MD for:  redness, tenderness, or signs of infection (pain, swelling, redness, odor or green/yellow discharge around incision site)   Complete by:  As directed    Call MD for:  severe uncontrolled pain   Complete by:  As directed    Call MD for:  temperature >100.4   Complete by:  As directed    Diet - low sodium heart healthy   Complete by:  As directed    Discharge instructions   Complete by:  As directed    Follow up with PCP, Neurology, and Cardiology as an outpatient. Take all medications as prescribed. If symptoms change or worsen please return to the ED for evaluation.   Increase activity slowly   Complete by:  As directed      Allergies as of 10/15/2017      Reactions   Other Other (See Comments)   ANESTHESIA-HALLUCINATIONS   Thorazine [chlorpromazine] Other (See Comments)   Unknown      Medication List    TAKE these medications   ALPRAZolam 0.25 MG tablet Commonly known as:  XANAX Take 1 tablet (0.25 mg total) by mouth daily as needed for anxiety.   baclofen 10 MG tablet Commonly known as:  LIORESAL Take 1 tablet (10 mg total) by mouth 3 (three) times daily as needed for muscle  spasms (leg muscle spasms). What changed:  when to take this   carbidopa-levodopa 50-200 MG tablet Commonly known as:  SINEMET CR Take 1 tablet by mouth at bedtime.   carbidopa-levodopa 25-100 MG tablet Commonly known as:  SINEMET IR 2 tablets at 6 am, 2 tablets at 9 am, 2 tablets at 12 pm, 2 tablets at 3 pm, 1 PRN at 5 pm   cyanocobalamin 1000 MCG/ML injection Commonly known as:  (VITAMIN B-12) Inject 1 mL (1,000 mcg total) into the muscle every 30 (thirty) days.   ELIQUIS 5 MG Tabs tablet Generic drug:  apixaban Take 5 mg by mouth 2 (two) times daily.   moxifloxacin 0.5 % ophthalmic solution Commonly known as:  VIGAMOX 1 drop 3 (three) times daily.   MULTI-VITAMINS Tabs Take 1 tablet by mouth daily.   NEEDLE (DISP) 22 G 22G X 1" Misc Use one monthly for B12 injections   neomycin-polymyxin-dexameth 0.1 % Oint Commonly known as:  MAXITROL 1 application.   pramipexole 0.25 MG tablet Commonly known as:  MIRAPEX TAKES 0.25 MG BY MOUTH THREE TIMES DAILY   pramipexole 1 MG tablet Commonly known as:  MIRAPEX Take 1 tablet (1 mg total) by mouth at bedtime.   prednisoLONE acetate 1 % ophthalmic suspension Commonly known as:  PRED FORTE 1 drop.   selegiline 5 MG capsule Commonly known as:  ELDEPRYL TAKES 5MG  BY MOUTH TWICE DAILY   Syringe (Disposable) 1 ML Misc Use once monthly for b12 injections       Allergies  Allergen Reactions  . Other Other (See Comments)    ANESTHESIA-HALLUCINATIONS  . Thorazine [Chlorpromazine] Other (See Comments)    Unknown    Consultations:  Neurology Dr. Milon DikesAshish Arora  Procedures/Studies: Dg Chest 2 View  Result Date: 10/14/2017 CLINICAL DATA:  Acute shortness of breath. EXAM: CHEST  2 VIEW COMPARISON:  09/23/2017 and 01/31/2017 radiographs FINDINGS: The cardiomediastinal silhouette is unremarkable. There is no evidence of focal airspace disease, pulmonary edema, suspicious pulmonary nodule/mass, pleural effusion, or  pneumothorax. No acute bony abnormalities are identified. Posterior fusion rods and pedicular screws within thoracic and lumbar spine again noted with multiple areas of vertebral augmentation. IMPRESSION: No active cardiopulmonary disease. Electronically Signed   By: Harmon PierJeffrey  Hu M.D.   On: 10/14/2017 18:27   Dg Chest 2 View  Result Date: 09/23/2017 CLINICAL DATA:  Acute lethargy and shortness of breath. Parkinson disease. EXAM: CHEST  2 VIEW COMPARISON:  01/31/2017 FINDINGS: Hyperinflation consistent with emphysema. No airspace disease or consolidation in the lungs. Heart size and pulmonary vascularity are normal. No blunting of costophrenic angles. No pneumothorax. Mediastinal contours appear intact. Postoperative posterior rod and screw fixation of the thoracolumbar spine. Multiple thoracic compression deformities, many with post kyphoplasty changes. IMPRESSION: Emphysematous changes in the lungs. No evidence of active pulmonary disease. Electronically Signed   By: Burman NievesWilliam  Stevens M.D.   On: 09/23/2017 21:25   Ct Angio Chest Pe W Or Wo Contrast  Result Date: 10/15/2017 CLINICAL DATA:  Shortness of breath. No chest pain. Prior history of pulmonary embolism. EXAM: CT ANGIOGRAPHY CHEST WITH CONTRAST TECHNIQUE: Multidetector CT imaging of the chest was performed using the standard protocol during bolus administration of intravenous contrast. Multiplanar CT image reconstructions and MIPs were obtained to evaluate the vascular anatomy. CONTRAST:  < 65 cc > ISOVUE-370 IOPAMIDOL (ISOVUE-370) INJECTION 76% COMPARISON:  Chest x-ray from yesterday. FINDINGS: Cardiovascular: Satisfactory opacification of the pulmonary arteries to the segmental level. No evidence of pulmonary embolism. There is a small amount of hyperdense material in the azygos vein and right upper and right lower lobe pulmonary arteries, likely cement. Enlargement of the main pulmonary artery, measuring up to 3.4 cm. Mild cardiomegaly. No  pericardial effusion. Normal caliber thoracic aorta. Mediastinum/Nodes: No enlarged mediastinal, hilar, or axillary lymph nodes. Thyroid gland, trachea, and esophagus demonstrate no significant findings. Lungs/Pleura: Bibasilar atelectasis. The lungs are otherwise clear. No suspicious pulmonary nodule. No focal consolidation, pleural effusion, or pneumothorax. Upper Abdomen: Reflux of contrast into the intrahepatic veins. No acute abnormality. Musculoskeletal: No acute osseous abnormality. Prior thoracolumbar fusion. The pedicle screws at T5 approach the superior endplate. Prior kyphoplasty at T4, T5 and T10. A small amount of cement material is seen in the paraspinous veins. Focal kyphoses at T4-T5 and T10-T11 are unchanged. Review of the MIP images confirms the above findings. IMPRESSION: 1. No evidence of acute pulmonary embolism. Small amount of hyperdense material in the right upper and right lower lobe pulmonary arteries likely represents cement related to prior kyphoplasties. This finding is chronic, and was seen  on prior chest x-ray from March 2018. 2. Enlarged main pulmonary artery, suggestive of pulmonary arterial hypertension. Evidence of elevated right heart pressures with reflux of contrast into the intrahepatic veins. Electronically Signed   By: Obie Dredge M.D.   On: 10/15/2017 12:39   Ct Head Code Stroke Wo Contrast  Result Date: 10/14/2017 CLINICAL DATA:  Code stroke. 81 year old female with right side facial weakness. EXAM: CT HEAD WITHOUT CONTRAST TECHNIQUE: Contiguous axial images were obtained from the base of the skull through the vertex without intravenous contrast. COMPARISON:  Head CTs without contrast 11/27/202018 and earlier. FINDINGS: Brain: Stable and normal for age cerebral volume. No midline shift, ventriculomegaly, mass effect, evidence of mass lesion, intracranial hemorrhage or evidence of cortically based acute infarction. Gray-white matter differentiation is within normal  limits throughout the brain. Vascular: Calcified atherosclerosis at the skull base. No suspicious intracranial vascular hyperdensity. Skull:  Stable, negative. Sinuses/Orbits: Stable with mild maxillary and ethmoid mucosal thickening. Other: Interval postoperative changes to the right globe. Otherwise stable orbit and scalp soft tissues. ASPECTS (Alberta Stroke Program Early CT Score) - Ganglionic level infarction (caudate, lentiform nuclei, internal capsule, insula, M1-M3 cortex): 7 - Supraganglionic infarction (M4-M6 cortex): 3 Total score (0-10 with 10 being normal): 10 IMPRESSION: 1. Stable and normal for age noncontrast CT appearance of the brain. 2. ASPECTS is 10. 3. The above was relayed via text pager to Dr. Marthe Patch on 10/14/2017 at 16:52 . Electronically Signed   By: Odessa Fleming M.D.   On: 10/14/2017 16:53   ECHOCARDIOGRAM ------------------------------------------------------------------- Study Conclusions  - Left ventricle: The cavity size was normal. Wall thickness was   increased in a pattern of mild LVH. Systolic function was normal.   The estimated ejection fraction was in the range of 55% to 60%.   Wall motion was normal; there were no regional wall motion   abnormalities. Left ventricular diastolic function parameters   were normal. - Aortic valve: There was mild regurgitation. - Mitral valve: Calcified annulus. Mildly thickened leaflets . - Atrial septum: No defect or patent foramen ovale was identified.  Subjective: Seen and examined and was feeling better but was exteremly anxious. Had tremors. No CP or SOB. Wanting to go back to facility.   Discharge Exam: Vitals:   10/15/17 1200 10/15/17 1230  BP: 121/65 (!) 130/99  Pulse: 74 (!) 103  Resp: 15 19  Temp:    SpO2: 100% 100%   Vitals:   10/15/17 0830 10/15/17 0900 10/15/17 1200 10/15/17 1230  BP: 139/70 (!) 153/72 121/65 (!) 130/99  Pulse: 80 97 74 (!) 103  Resp: 15 19 15 19   Temp:      TempSrc:      SpO2: 98% 100%  100% 100%  Weight:      Height:       General: Pt is alert, awake, not in acute distress Cardiovascular: RRR, S1/S2 +, no rubs, no gallops Respiratory: Diminished bilaterally, no wheezing, no rhonchi Abdominal: Soft, NT, ND, bowel sounds + Extremities: no edema, no cyanosis Neuro: Has tremors bilaterally; no appreciable facial droop  The results of significant diagnostics from this hospitalization (including imaging, microbiology, ancillary and laboratory) are listed below for reference.    Microbiology: No results found for this or any previous visit (from the past 240 hour(s)).   Labs: BNP (last 3 results) Recent Labs    10/14/17 1953  BNP 162.5*   Basic Metabolic Panel: Recent Labs  Lab 10/14/17 1630 10/14/17 1641 10/15/17 0550  NA 141  141 139  K 4.1 4.1 4.0  CL 107 102 107  CO2 29  --  25  GLUCOSE 130* 129* 122*  BUN 13 16 13   CREATININE 0.63 0.60 0.66  CALCIUM 9.2  --  8.7*   Liver Function Tests: Recent Labs  Lab 10/14/17 1630 10/15/17 0550  AST 17 15  ALT <5* <5*  ALKPHOS 86 77  BILITOT 0.4 0.6  PROT 6.7 5.9*  ALBUMIN 4.0 3.4*   No results for input(s): LIPASE, AMYLASE in the last 168 hours. No results for input(s): AMMONIA in the last 168 hours. CBC: Recent Labs  Lab 10/14/17 1630 10/14/17 1641 10/15/17 0550  WBC 3.5*  --  3.7*  NEUTROABS 2.2  --   --   HGB 11.1* 11.9* 11.4*  HCT 34.2* 35.0* 34.8*  MCV 100.9*  --  100.0  PLT 196  --  220   Cardiac Enzymes: Recent Labs  Lab 10/15/17 0550  TROPONINI <0.03   BNP: Invalid input(s): POCBNP CBG: Recent Labs  Lab 10/14/17 1633  GLUCAP 125*   D-Dimer Recent Labs    10/14/17 1734  DDIMER 0.70*   Hgb A1c Recent Labs    10/15/17 0413  HGBA1C 6.0*   Lipid Profile Recent Labs    10/15/17 0315  CHOL 189  HDL 76  LDLCALC 103*  TRIG 50  CHOLHDL 2.5   Thyroid function studies No results for input(s): TSH, T4TOTAL, T3FREE, THYROIDAB in the last 72 hours.  Invalid input(s):  FREET3 Anemia work up Recent Labs    10/15/17 0550  VITAMINB12 376   Urinalysis    Component Value Date/Time   COLORURINE STRAW (A) 10/14/2017 1834   APPEARANCEUR CLEAR 10/14/2017 1834   LABSPEC 1.009 10/14/2017 1834   PHURINE 8.0 10/14/2017 1834   GLUCOSEU NEGATIVE 10/14/2017 1834   HGBUR NEGATIVE 10/14/2017 1834   BILIRUBINUR NEGATIVE 10/14/2017 1834   KETONESUR NEGATIVE 10/14/2017 1834   PROTEINUR NEGATIVE 10/14/2017 1834   NITRITE NEGATIVE 10/14/2017 1834   LEUKOCYTESUR SMALL (A) 10/14/2017 1834   Sepsis Labs Invalid input(s): PROCALCITONIN,  WBC,  LACTICIDVEN Microbiology No results found for this or any previous visit (from the past 240 hour(s)).  Time coordinating discharge: 35 minutes  SIGNED:  [name], DO [facility] 10/15/2017, 1:47 PM Pager [phone]  If 7PM-7AM, please contact night-coverage [email or web address] Password [email or web address]

## 2017-10-15 NOTE — ED Notes (Signed)
Neurologist at bedside at this time.

## 2017-10-15 NOTE — ED Notes (Signed)
This RN called to MRI to assist pt with using the restroom. Pt reported Parkinson sx and stated she may not be able to complete MRI without medication and requested Sinemet.

## 2017-10-15 NOTE — ED Notes (Signed)
Assisted patient to restroom. Gait unsteady. No acute distress noted.

## 2017-10-15 NOTE — ED Notes (Signed)
[facility] Report has been called.

## 2017-10-15 NOTE — ED Notes (Addendum)
Called ECHO to request patient be seen soon for procedure - per MD request.

## 2017-10-15 NOTE — ED Notes (Signed)
[facility] to [facility]. Left message on answering machine with name and number to return call for report.

## 2017-10-15 NOTE — ED Notes (Signed)
Admitting MD at bedside.

## 2017-10-17 ENCOUNTER — Telehealth: Payer: Self-pay | Admitting: Neurology

## 2017-10-17 DIAGNOSIS — R413 Other amnesia: Secondary | ICD-10-CM

## 2017-10-17 NOTE — Telephone Encounter (Signed)
Pt called and said she has a new issue going on and [facility] and see if she needs to come in to see Dr Tat about it

## 2017-10-17 NOTE — Telephone Encounter (Signed)
Next appt scheduled 12/04/17.

## 2017-10-17 NOTE — Telephone Encounter (Signed)
Spoke with patient and she is concerned about "black out" episodes she has been having. States few days ago she was walking down the hall and sat in her living room. The next thing she new is that people were all around her trying to open in her eyes. She remembers nothing between these activities. She states this is the third incident of this happening. Also complaining of increased tremors and weakness.  ER notes in Epic. They mentioned possible EEG and MR as outpatient.  Please advise.

## 2017-10-17 NOTE — Telephone Encounter (Signed)
She was seen by neurology in the hospital.  We did out patient EEG not that long ago.  Does she have f/u appointment?

## 2017-10-18 NOTE — Telephone Encounter (Signed)
We can schedule ambulatory EEG but according to hospital records doesn't sound like there was LOC or syncope with event and the suspicion was low for seizure.

## 2017-10-20 NOTE — Telephone Encounter (Signed)
Patient made aware. She is agreeable to have EEG. How long should the Amb EEG be scheduled for?

## 2017-10-20 NOTE — Telephone Encounter (Signed)
Order entered. Message to [place].

## 2017-10-20 NOTE — Addendum Note (Signed)
Addended bySilvio Pate: Guinn Delarosa L on: 10/20/2017 12:57 PM   Modules accepted: Orders

## 2017-10-20 NOTE — Telephone Encounter (Signed)
24 hours  

## 2017-10-30 ENCOUNTER — Encounter (HOSPITAL_COMMUNITY): Payer: Self-pay | Admitting: Emergency Medicine

## 2017-10-30 ENCOUNTER — Emergency Department (HOSPITAL_COMMUNITY)
Admission: EM | Admit: 2017-10-30 | Discharge: 2017-10-31 | Disposition: A | Discharge status: Home / Self Care | Payer: Medicare Other | Attending: Emergency Medicine | Admitting: Emergency Medicine

## 2017-10-30 DIAGNOSIS — Z7901 Long term (current) use of anticoagulants: Secondary | ICD-10-CM | POA: Insufficient documentation

## 2017-10-30 DIAGNOSIS — R404 Transient alteration of awareness: Secondary | ICD-10-CM | POA: Insufficient documentation

## 2017-10-30 DIAGNOSIS — Z79899 Other long term (current) drug therapy: Secondary | ICD-10-CM | POA: Insufficient documentation

## 2017-10-30 DIAGNOSIS — G20 Parkinson's disease: Secondary | ICD-10-CM | POA: Diagnosis not present

## 2017-10-30 DIAGNOSIS — R4182 Altered mental status, unspecified: Secondary | ICD-10-CM | POA: Diagnosis present

## 2017-10-30 LAB — CBG MONITORING, ED: Glucose-Capillary: 106 mg/dL — ABNORMAL HIGH (ref 65–99)

## 2017-10-30 NOTE — ED Triage Notes (Signed)
[facility] E[facility] was first called out for unresponsive. When ems arrive pt was response. Is axox1. Pt states she has parkinsons which is now causing her to become affected her swallowing. Pt is usually independent in the facility and is able to ambulate with walker.

## 2017-10-31 ENCOUNTER — Emergency Department (HOSPITAL_COMMUNITY): Payer: Medicare Other

## 2017-10-31 LAB — COMPREHENSIVE METABOLIC PANEL
ALBUMIN: 3.6 g/dL (ref 3.5–5.0)
ALT: 7 U/L — ABNORMAL LOW (ref 14–54)
AST: 17 U/L (ref 15–41)
Alkaline Phosphatase: 70 U/L (ref 38–126)
Anion gap: 5 (ref 5–15)
BUN: 17 mg/dL (ref 6–20)
CHLORIDE: 107 mmol/L (ref 101–111)
CO2: 26 mmol/L (ref 22–32)
Calcium: 8.9 mg/dL (ref 8.9–10.3)
Creatinine, Ser: 0.71 mg/dL (ref 0.44–1.00)
GFR calc Af Amer: >60 mL/min (ref >60)
GFR calc non Af Amer: >60 mL/min (ref >60)
GLUCOSE: 110 mg/dL — AB (ref 65–99)
POTASSIUM: 3.8 mmol/L (ref 3.5–5.1)
Sodium: 138 mmol/L (ref 135–145)
Total Bilirubin: 0.4 mg/dL (ref 0.3–1.2)
Total Protein: 6 g/dL — ABNORMAL LOW (ref 6.5–8.1)

## 2017-10-31 LAB — PROTIME-INR
INR: 1.15
PROTHROMBIN TIME: 14.6 s (ref 11.4–15.2)

## 2017-10-31 LAB — URINALYSIS, ROUTINE W REFLEX MICROSCOPIC
BILIRUBIN URINE: NEGATIVE
GLUCOSE, UA: NEGATIVE mg/dL
Hgb urine dipstick: NEGATIVE
KETONES UR: 5 mg/dL — AB
Leukocytes, UA: NEGATIVE
Nitrite: NEGATIVE
PH: 5 (ref 5.0–8.0)
PROTEIN: NEGATIVE mg/dL
Specific Gravity, Urine: 1.021 (ref 1.005–1.030)

## 2017-10-31 LAB — ETHANOL: Alcohol, Ethyl (B): <10 mg/dL (ref <10)

## 2017-10-31 LAB — I-STAT TROPONIN, ED: Troponin i, poc: 0 ng/mL (ref 0.00–0.08)

## 2017-10-31 LAB — CBC
HEMATOCRIT: 32 % — AB (ref 36.0–46.0)
Hemoglobin: 10.8 g/dL — ABNORMAL LOW (ref 12.0–15.0)
MCH: 33.5 pg (ref 26.0–34.0)
MCHC: 33.8 g/dL (ref 30.0–36.0)
MCV: 99.4 fL (ref 78.0–100.0)
Platelets: 182 10*3/uL (ref 150–400)
RBC: 3.22 MIL/uL — ABNORMAL LOW (ref 3.87–5.11)
RDW: 13.7 % (ref 11.5–15.5)
WBC: 3.6 10*3/uL — ABNORMAL LOW (ref 4.0–10.5)

## 2017-10-31 LAB — DIFFERENTIAL
BASOS ABS: 0 10*3/uL (ref 0.0–0.1)
Basophils Relative: 0 %
EOS ABS: 0.1 10*3/uL (ref 0.0–0.7)
Eosinophils Relative: 2 %
LYMPHS ABS: 1.2 10*3/uL (ref 0.7–4.0)
Lymphocytes Relative: 34 %
MONO ABS: 0.3 10*3/uL (ref 0.1–1.0)
MONOS PCT: 9 %
Neutro Abs: 2 10*3/uL (ref 1.7–7.7)
Neutrophils Relative %: 55 %

## 2017-10-31 LAB — RAPID URINE DRUG SCREEN, HOSP PERFORMED
AMPHETAMINES: NOT DETECTED
BARBITURATES: NOT DETECTED
Benzodiazepines: NOT DETECTED
COCAINE: NOT DETECTED
OPIATES: NOT DETECTED
TETRAHYDROCANNABINOL: NOT DETECTED

## 2017-10-31 LAB — APTT: aPTT: 29 seconds (ref 24–36)

## 2017-10-31 MED ORDER — BACLOFEN 10 MG PO TABS
10.0000 mg | ORAL_TABLET | Freq: Three times a day (TID) | ORAL | Status: DC
  Administered 2017-10-31: 10 mg via ORAL
  Filled 2017-10-31 (×3): qty 1

## 2017-10-31 MED ORDER — CARBIDOPA-LEVODOPA 25-100 MG PO TABS
2.0000 | ORAL_TABLET | Freq: Once | ORAL | Status: AC
  Administered 2017-10-31: 2 via ORAL
  Filled 2017-10-31: qty 2

## 2017-10-31 NOTE — ED Notes (Signed)
RN attempted to ambulate however when RN attempted to wake her she said "in a minute."  RN went back turned the lights on and attempted again, same response.  RN attempted three times total.

## 2017-10-31 NOTE — Progress Notes (Signed)
CS[facility]. CSW has sent over needed information all but PT notes. CSW will send over remaining information when available. CSW still following for discharge needs.    Claude MangesKierra S. Jorja Empie, MSW, LCSW-A Emergency Department Clinical Social Worker [phone]

## 2017-10-31 NOTE — ED Notes (Signed)
Dr. Verdie MosherLiu aware of Nichole Krueger complaints. MD assessed Nichole Krueger after initial reports of L sided facial tightness. Nichole Krueger also requesting to be admitted. Dr. Verdie MosherLiu to explain to Nichole Krueger she is stable enough to be d/c after social work consult.

## 2017-10-31 NOTE — ED Notes (Signed)
Provider bedside, pt cooperated w/ [facility], no deficies at this time.

## 2017-10-31 NOTE — ED Notes (Signed)
Pt placed on bed pan. Awaiting baclofen from pharmacy

## 2017-10-31 NOTE — ED Notes (Signed)
[facility] to [facility]. Message left on Lurena JoinerRebecca, Dance movement psychotherapistdirector answering machine with call back number per [facility] operator. [facility] called. Will continue to try and call report. All AVS and instructions [facility] by [facility]

## 2017-10-31 NOTE — ED Notes (Signed)
PT at bedside.

## 2017-10-31 NOTE — Evaluation (Signed)
Physical Therapy Evaluation Patient Details Name: Nichole Krueger MRN: [id number] DOB: [birth year] Today's Date: 10/31/2017   History of Present Illness  Pt is a 81 y/o female presenting to ED secondary to AMS and dysphagia. PMH includes a fib, macular degeneration, PE, and parkinson's.   Clinical Impression  Pt presenting to ED with problem above and deficits below. PTA, pt was independent using RW, however, [facility]. Upon eval, pt presenting with tremors (baseline Parkinson's), decreased balance, weakness, and decreased cognition. Only performed side steps along EOB secondary to unsteadiness. Required min A for steadying assist throughout. Recommending SNF at d/c to address mobility deficits and increase safety with mobility prior to return to ILF. Will continue to follow acutely to maximize functional mobility independence and safety.     Follow Up Recommendations SNF;Supervision/Assistance - 24 hour    Equipment Recommendations  None recommended by PT    Recommendations for Other Services       Precautions / Restrictions Precautions Precautions: Fall Restrictions Weight Bearing Restrictions: No      Mobility  Bed Mobility Overal bed mobility: Needs Assistance Bed Mobility: Supine to Sit;Sit to Supine     Supine to sit: Min assist Sit to supine: Min assist   General bed mobility comments: Min A for trunk elevation and for LE lift assist for return to supine. Required elevated HOB and use of bed rails. Also required extended time to complete.   Transfers Overall transfer level: Needs assistance Equipment used: 1 person hand held assist Transfers: Sit to/from Stand Sit to Stand: Min assist         General transfer comment: Min A for steadying assist to stand. PT stood in front of pt and performed hand over hand for steadying during transfers. Required increased time to stand.   Ambulation/Gait Ambulation/Gait assistance: Min  assist Ambulation Distance (Feet): 1 Feet Assistive device: 1 person hand held assist Gait Pattern/deviations: Step-to pattern;Decreased stride length Gait velocity: Decreased Gait velocity interpretation: Below normal speed for age/gender General Gait Details: Slow, unsteady gait. Required min A for steadying and held on to PT during side stepping along EOB. Unsafe to perform gait away from bed at this time. Pt reports increased fatigue as well with short ambulation distance.   Stairs            Wheelchair Mobility    Modified Rankin (Stroke Patients Only)       Balance   Sitting-balance support: Bilateral upper extremity supported;Feet unsupported Sitting balance-Leahy Scale: Fair     Standing balance support: Bilateral upper extremity supported;During functional activity Standing balance-Leahy Scale: Poor Standing balance comment: Reliant on BUE support and external support                              Pertinent Vitals/Pain Pain Assessment: 0-10 Pain Score: 7  Pain Location: headache  Pain Descriptors / Indicators: Headache Pain Intervention(s): Limited activity within patient's tolerance;Repositioned;Monitored during session    Home Living Family/patient expects to be discharged to:: Private residence Living Arrangements: Alone Available Help at Discharge: Other (Comment)(staff available for med administration) Type of Home: Independent living facility([facility] ) Home Access: Elevator     Home Layout: One level Home Equipment: Walker - 2 wheels;Tub bench;Grab bars - toilet;Grab bars - tub/shower      Prior Function Level of Independence: Independent with assistive device(s)         Comments: RW for mobility  Hand Dominance   Dominant Hand: Right    Extremity/Trunk Assessment   Upper Extremity Assessment Upper Extremity Assessment: Generalized weakness(Tremors at baseline and numbness in hands )    Lower Extremity  Assessment Lower Extremity Assessment: Generalized weakness(numbness in L foot )    Cervical / Trunk Assessment Cervical / Trunk Assessment: Kyphotic  Communication   Communication: No difficulties  Cognition Arousal/Alertness: Awake/alert Behavior During Therapy: WFL for tasks assessed/performed Overall Cognitive Status: Impaired/Different from baseline Area of Impairment: Memory;Problem solving                     Memory: Decreased short-term memory       Problem Solving: Slow processing;Decreased initiation;Requires verbal cues        General Comments General comments (skin integrity, edema, etc.): Educated pt about current deficits and need for SNF and pt agreeable.     Exercises     Assessment/Plan    PT Assessment Patient needs continued PT services  PT Problem List Decreased strength;Decreased range of motion;Decreased coordination;Decreased cognition;Decreased mobility;Decreased balance;Decreased knowledge of use of DME;Decreased activity tolerance;Pain       PT Treatment Interventions DME instruction;Gait training;Functional mobility training;Therapeutic activities;Therapeutic exercise;Balance training;Neuromuscular re-education;Patient/family education;Cognitive remediation    PT Goals (Current goals can be found in the Care Plan section)  Acute Rehab PT Goals Patient Stated Goal: to get better  PT Goal Formulation: With patient Time For Goal Achievement: 11/07/17 Potential to Achieve Goals: Fair    Frequency Min 2X/week   Barriers to discharge Decreased caregiver support Lives alone in ILF     Co-evaluation               AM-PAC PT "6 Clicks" Daily Activity  Outcome Measure Difficulty turning over in bed (including adjusting bedclothes, sheets and blankets)?: A Little Difficulty moving from lying on back to sitting on the side of the bed? : Unable Difficulty sitting down on and standing up from a chair with arms (e.g., wheelchair,  bedside commode, etc,.)?: Unable Help needed moving to and from a bed to chair (including a wheelchair)?: A Little Help needed walking in hospital room?: A Lot Help needed climbing 3-5 steps with a railing? : A Lot 6 Click Score: 12    End of Session Equipment Utilized During Treatment: Gait belt Activity Tolerance: Patient limited by fatigue Patient left: in bed;with call bell/phone within reach Nurse Communication: Mobility status PT Visit Diagnosis: Unsteadiness on feet (R26.81);Other abnormalities of gait and mobility (R26.89);Other symptoms and signs involving the nervous system (R29.898)    Time: [id number] PT Time Calculation (min) (ACUTE ONLY): 23 min   Charges:   PT Evaluation $PT Eval Moderate Complexity: 1 Mod PT Treatments $Therapeutic Activity: 8-22 mins   PT G Codes:   PT G-Codes **NOT FOR INPATIENT CLASS** Functional Assessment Tool Used: AM-PAC 6 Clicks Basic Mobility Functional Limitation: Mobility: Walking and moving around Mobility: Walking and Moving Around Current Status ([id number](G8978): At least 60 percent but less than 80 percent impaired, limited or restricted Mobility: Walking and Moving Around Goal Status [phone](G8979): At least 40 percent but less than 60 percent impaired, limited or restricted    Gladys DammeBrittany Kortney Potvin, PT, DPT  Acute Rehabilitation Services  Pager: [phone]   Lehman PromBrittany S Peola Joynt 10/31/2017, 2:42 PM

## 2017-10-31 NOTE — ED Notes (Signed)
Pt stood at bedside and was too weak to take any steps. Pt says that she can't walk without taking her medicine.

## 2017-10-31 NOTE — ED Provider Notes (Signed)
Please see previous physicians note regarding patient's presenting history and physical, initial ED course, and associated medical decision making.  81 year old female with history of Parkinson's disease pending evaluation by social work.  Her workup in the ED has been reassuring.  Her mental status currently is baseline.  Has been having some issues with Parkinson's disease, question if she could care for herself at home.  She was evaluated by speech therapy, physical therapy here in the emergency department.  They are recommending skilled nursing facility placement.  Her nursing facility has an assisted living and skilled nursing facility component.  Our social worker has arranged patient to be transferred to the skilled nursing facility aspect.   Lavera GuiseLiu, Leandro Berkowitz Duo, MD 10/31/17 [phone]

## 2017-10-31 NOTE — Evaluation (Addendum)
Clinical/Bedside Swallow Evaluation Patient Details  Name: Nichole Krueger MRN: [id number] Date of Birth: Apr 06, 1936  Today's Date: 10/31/2017 Time: SLP Start Time (ACUTE ONLY): 0940 SLP Stop Time (ACUTE ONLY): 1030 SLP Time Calculation (min) (ACUTE ONLY): 50 min  Past Medical History:  Past Medical History:  Diagnosis Date  . Anxiety   . Atrial fibrillation (HCC)   . Macular degeneration   . Parkinson's disease (HCC)   . Pulmonary embolism [facility](HCC)    Past Surgical History:  Past Surgical History:  Procedure Laterality Date  . BACK SURGERY  2017   x3  . CATARACT EXTRACTION Right   . EXTERNAL FIXATION ARM     HPI:  81 year old female admitted to ED 10/31/17 with syncope. PMH significant for Parkinson's disease, anxiety, AFib, macular degeneration.   Assessment / Plan / Recommendation Clinical Impression  SLP called to ED to assess swallow function and safety due to failed swallow screen. Pt had reported history of dysphagia, and indicated to SLP that she has intermittent difficulty with pills - that they get stuck on the roof of her mouth. Pt reports tolerating regular solids and thin liquids at home, and takes pills one at a time, either with liquid or puree.   Initally, [facility], volitional cough was weak, laryngeal elevation adequate on palpation. No obvious oral difficulty with thin, puree, or solid. No overt s/s aspiration or post-swallow residue on any consistency. RN arrived to provide meds, which were given one at a time in puree, and pt tolerated well.   Following evaluation after SLP reviewed safe swallow precautions, pt reported her face felt "contorted". Cranial nerve exam was repeated, and was unchanged/unremarkable except for slight labial droop on the left. Pt reported feeling as if she were drooling out of the left side of her mouth. RN notified immediately. Pt continued to report "pulling" on the left, and exhibited slight pooling of oral secretions in  the left corner of the mouth.   RN indicated MD was coming to see her.  At this time, will recommend Dys 3 diet with chopped meats, thin liquids, extra gravy for moisture. Rec meds be given one at a time, with either liquids or puree, per pt preference. Safe swallow precautions reviewed with pt and [facility]. RN was encouraged to send information with patient, whether admitted or sent home. ST will follow acutely for diet tolerance and education. If not admitted, recommend ST follow up given change in status during evaluation.  SLP Visit Diagnosis: Dysphagia, unspecified (R13.10)    Aspiration Risk  Mild aspiration risk    Diet Recommendation Dysphagia 3 (Mech soft);Thin liquid   Liquid Administration via: Cup;Straw Medication Administration: Whole meds with puree Supervision: Patient able to self feed;Intermittent supervision to cue for compensatory strategies Compensations: Minimize environmental distractions;Small sips/bites;Slow rate Postural Changes: Seated upright at 90 degrees;Remain upright for at least 30 minutes after po intake    Other  Recommendations Oral Care Recommendations: Oral care BID   Follow up Recommendations (tbd)      Frequency and Duration min 1 x/week  2 weeks;1 week       Prognosis Prognosis for Safe Diet Advancement: Fair Barriers to Reach Goals: (Parkinson's)      Swallow Study   General Date of Onset: 10/31/17 HPI: 81 year old female admitted to ED 10/31/17 with syncope. PMH significant for Parkinson's disease Type of Study: Bedside Swallow Evaluation Previous Swallow Assessment: none found Diet Prior to this Study: NPO Temperature Spikes  Noted: No Respiratory Status: Room air History of Recent Intubation: No Behavior/Cognition: Alert;Cooperative;Pleasant mood Oral Cavity Assessment: Within Functional Limits Oral Care Completed by SLP: No Oral Cavity - Dentition: Adequate natural dentition Vision: Functional for  self-feeding Self-Feeding Abilities: Needs assist;Needs set up Patient Positioning: Upright in bed Baseline Vocal Quality: Normal Volitional Cough: Weak Volitional Swallow: Able to elicit    Oral/Motor/Sensory Function Overall Oral Motor/Sensory Function: Mild impairment Facial ROM: Reduced left Facial Symmetry: Abnormal symmetry left Facial Strength: Within Functional Limits Facial Sensation: Within Functional Limits Lingual ROM: Within Functional Limits Lingual Symmetry: Within Functional Limits Lingual Strength: Within Functional Limits Lingual Sensation: Within Functional Limits Mandible: Within Functional Limits   Ice Chips Ice chips: Within functional limits Presentation: Spoon   Thin Liquid Thin Liquid: Within functional limits Presentation: Cup;Straw    Nectar Thick Nectar Thick Liquid: Not tested   Honey Thick Honey Thick Liquid: Not tested   Puree Puree: Within functional limits Presentation: Spoon   Solid   GO   Solid: Within functional limits Presentation: Self Fed    Functional Assessment Tool Used: asha noms, clinical judgment,. BSE Functional Limitations: Swallowing Swallow Current Status [phone]): At least 40 percent but less than 60 percent impaired, limited or restricted Swallow Goal Status [phone](G8997): At least 40 percent but less than 60 percent impaired, limited or restricted   Celia B. Murvin NatalBueche, [facility] CampusMSP, CCC-SLP Speech Language Pathologist [phone]  Leigh AuroraBueche, Celia Brown 10/31/2017,10:35 AM

## 2017-10-31 NOTE — ED Notes (Signed)
Attempted to call facility per provider's request to gain additional information however was unsuccessful, no answer

## 2017-10-31 NOTE — ED Notes (Signed)
Care management called for update on pt plan of care. Per care management, SW and facility was under the impression pt was to be admitted however current plan is for pt to be d/c. SW to be notified by case management. Will update Dr. Verdie MosherLiu

## 2017-10-31 NOTE — ED Notes (Signed)
PT assigned to pt paged

## 2017-10-31 NOTE — ED Notes (Signed)
Liu, MD aware of pt c/o L sided facial tightness, pt to be evaluated by MD, not other deficits noted at this time, Pharmacy to verify baclofen

## 2017-10-31 NOTE — ED Provider Notes (Signed)
She now reports she is unsure if she can swallow She is awake and alert voice is normal Continues to remain neurologically intact Still suspect this is all related to her underlying Parkinson's and is chronic Due to multiple issues I will consult case management, social work, for further help she may need placement in a higher level of care   Zadie RhineWickline, Ceaser Ebeling, MD 10/31/17 [phone]

## 2017-10-31 NOTE — ED Notes (Signed)
Per Dr. Verdie MosherLiu, awaiting PT consult for d/c. Will f/u with PT

## 2017-10-31 NOTE — ED Notes (Signed)
Pt continues to report L sided facial tightness. Will notify MD.

## 2017-10-31 NOTE — Discharge Instructions (Addendum)
Your work-up today has been reassuring.   You were seen by our physical therapist and Child psychotherapistsocial worker. We recommend that you be transferred to a higher level of care, a skilled nursing facility.   Please return without fail for worsening symptoms

## 2017-10-31 NOTE — ED Provider Notes (Signed)
[facility] Provider Note   CSN: [id number] Arrival date & time: 10/30/17  2328     History   Chief Complaint Chief Complaint  Patient presents with  . Altered Mental Status  . Dysphagia   Level 5 caveat due to altered mental status HPI Nichole Krueger is a 81 y.o. female.  The history is provided by the patient.  Altered Mental Status   This is a new problem. Episode onset: Unknown. The problem has not changed since onset. Patient with history of anxiety, atrial fibrillation, Parkinson's disease presents with altered mental status There are very few details about this case Apparently patient was noted to be altered and unresponsive so EMS was called EMS reports that she was responsive on arrival but slow to speak Currently patient is awake alert but slow to respond and can provide very few details  Past Medical History:  Diagnosis Date  . Anxiety   . Atrial fibrillation (HCC)   . Macular degeneration   . Parkinson's disease (HCC)   . Pulmonary embolism [facility](HCC)     Patient Active Problem List   Diagnosis Date Noted  . Impaired fasting glucose 10/15/2017  . Normocytic anemia 10/15/2017  . Pulmonary arterial hypertension (HCC) 10/15/2017  . Chest pain 10/14/2017  . Orthostatic syncope 05/06/2017  . Syncope 05/04/2017  . Muscle rigidity 01/31/2017  . AF (paroxysmal atrial fibrillation) (HCC) 01/31/2017  . Parkinson's disease (HCC) 01/31/2017    Past Surgical History:  Procedure Laterality Date  . BACK SURGERY  2017   x3  . CATARACT EXTRACTION Right   . EXTERNAL FIXATION ARM      OB History    No data available       Home Medications    Prior to Admission medications   Medication Sig Start Date End Date Taking? Authorizing Provider  ALPRAZolam (XANAX) 0.25 MG tablet Take 1 tablet (0.25 mg total) by mouth daily as needed for anxiety. 10/15/17   Marguerita MerlesSheikh, Omair Latif, DO  apixaban (ELIQUIS) 5 MG TABS tablet Take 5 mg  by mouth 2 (two) times daily.    [provider]  baclofen (LIORESAL) 10 MG tablet Take 1 tablet (10 mg total) by mouth 3 (three) times daily as needed for muscle spasms (leg muscle spasms). Patient taking differently: Take 10 mg by mouth 3 (three) times daily.  02/01/17   Rolly SalterPatel, Pranav M, MD  carbidopa-levodopa (SINEMET CR) 50-200 MG tablet Take 1 tablet by mouth at bedtime. 08/25/17   Tat, Octaviano Battyebecca S, DO  carbidopa-levodopa (SINEMET IR) 25-100 MG tablet 2 tablets at 6 am, 2 tablets at 9 am, 2 tablets at 12 pm, 2 tablets at 3 pm, 1 PRN at 5 pm 08/26/17   Tat, Octaviano Battyebecca S, DO  cyanocobalamin (,VITAMIN B-12,) 1000 MCG/ML injection Inject 1 mL (1,000 mcg total) into the muscle every 30 (thirty) days. 07/24/17   Tat, Octaviano Battyebecca S, DO  moxifloxacin (VIGAMOX) 0.5 % ophthalmic solution 1 drop 3 (three) times daily.    [provider]  Multiple Vitamin (MULTI-VITAMINS) TABS Take 1 tablet by mouth daily. 05/14/11   [provider]  NEEDLE, DISP, 22 G 22G X 1" MISC Use one monthly for B12 injections 10/13/17   Tat, Rebecca S, DO  neomycin-polymyxin-dexameth (MAXITROL) 0.1 % OINT 1 application.    [provider]  pramipexole (MIRAPEX) 0.25 MG tablet TAKES 0.25 MG BY MOUTH THREE TIMES DAILY 08/25/17   Tat, Octaviano Battyebecca S, DO  pramipexole (MIRAPEX) 1 MG tablet Take 1  tablet (1 mg total) by mouth at bedtime. 08/25/17   Tat, Octaviano Batty, DO  prednisoLONE acetate (PRED FORTE) 1 % ophthalmic suspension 1 drop.    [provider]  selegiline (ELDEPRYL) 5 MG capsule TAKES 5MG  BY MOUTH TWICE DAILY 01/03/17   [provider]  Syringe, Disposable, 1 ML MISC Use once monthly for b12 injections 10/13/17   Tat, Octaviano Batty, DO    Family History Family History  Problem Relation Age of Onset  . Stroke Mother   . Heart attack Father   . Heart attack Sister     Social History Social History   Tobacco Use  . Smoking status: Never Smoker  . Smokeless tobacco: Never Used  Substance Use  Topics  . Alcohol use: Yes    Comment: occasional wine  . Drug use: No     Allergies   Other and Thorazine [chlorpromazine]   Review of Systems Review of Systems  Unable to perform ROS: Mental status change     Physical Exam Updated Vital Signs BP (!) 160/78 (BP Location: Right Arm)   Pulse 74   Temp 98.6 F (37 C) (Oral)   Resp 14   SpO2 99%   Physical Exam CONSTITUTIONAL: Elderly, frail, she is hunched over she is very slow to respond with a quiet voice HEAD: Normocephalic/atraumatic EYES: EOMI/PERRL ENMT: Mucous membranes moist NECK: supple no meningeal signs SPINE/BACK: Kyphotic spine CV: S1/S2 noted, no murmurs/rubs/gallops noted LUNGS: Lungs are clear to auscultation bilaterally, no apparent distress ABDOMEN: soft, nontender GU:no cva tenderness NEURO: Pt is awake/alert but very slow to respond.  Patient was present on exam, she has a tremor that appears to be related to her Parkinson's She has no gross focal focal motor deficit EXTREMITIES: pulses normal/equal, full ROM SKIN: warm, color normal PSYCH: Unable to assess  ED Treatments / Results  Labs (all labs ordered are listed, but only abnormal results are displayed) Labs Reviewed  COMPREHENSIVE METABOLIC PANEL - Abnormal; Notable for the following components:      Result Value   Glucose, Bld 110 (*)    Total Protein 6.0 (*)    ALT 7 (*)    All other components within normal limits  CBC - Abnormal; Notable for the following components:   WBC 3.6 (*)    RBC 3.22 (*)    Hemoglobin 10.8 (*)    HCT 32.0 (*)    All other components within normal limits  URINALYSIS, ROUTINE W REFLEX MICROSCOPIC - Abnormal; Notable for the following components:   Ketones, ur 5 (*)    All other components within normal limits  CBG MONITORING, ED - Abnormal; Notable for the following components:   Glucose-Capillary 106 (*)    All other components within normal limits  ETHANOL  PROTIME-INR  APTT  DIFFERENTIAL  RAPID  URINE DRUG SCREEN, HOSP PERFORMED  I-STAT TROPONIN, ED    EKG  EKG Interpretation  Date/Time:  Friday October 31 2017 00:02:38 EST Ventricular Rate:  78 PR Interval:    QRS Duration: 77 QT Interval:  376 QTC Calculation: 429 R Axis:   4 Text Interpretation:  Sinus rhythm Probable anterior infarct, old Interpretation limited secondary to artifact No significant change since last tracing Confirmed by Zadie Rhine ([id number]) on 10/31/2017 12:12:35 AM       Radiology Ct Head Wo Contrast  Result Date: 10/31/2017 CLINICAL DATA:  Altered mental status today. EXAM: CT HEAD WITHOUT CONTRAST TECHNIQUE: Contiguous axial images were obtained from the base of the  skull through the vertex without intravenous contrast. COMPARISON:  10/14/2017 FINDINGS: Brain: There is no intracranial hemorrhage, mass or evidence of acute infarction. There is mild generalized atrophy. There is mild chronic microvascular ischemic change. There is no significant extra-axial fluid collection. No acute intracranial findings are evident. Vascular: No hyperdense vessel or unexpected calcification. Skull: Normal. Negative for fracture or focal lesion. Sinuses/Orbits: No acute finding. Other: None. IMPRESSION: No acute intracranial findings. There is mild generalized atrophy and chronic appearing white matter hypodensities which likely represent mild small vessel ischemic disease. Electronically Signed   By: Ellery Plunkaniel R Mitchell M.D.   On: 10/31/2017 01:49   Dg Chest Port 1 View  Result Date: 10/31/2017 CLINICAL DATA:  Weakness and dyspnea for 1 day.  Possible syncope. EXAM: PORTABLE CHEST 1 VIEW COMPARISON:  10/14/2017 FINDINGS: A single AP portable view of the chest demonstrates no focal airspace consolidation or alveolar edema. The lungs are grossly clear. There is no large effusion or pneumothorax. There is unchanged cardiomegaly. Cardiac and mediastinal contours are otherwise unremarkable. IMPRESSION: Unchanged cardiomegaly.   No consolidation.  Normal vasculature. Electronically Signed   By: Ellery Plunkaniel R Mitchell M.D.   On: 10/31/2017 01:51    Procedures Procedures    Medications Ordered in ED Medications  carbidopa-levodopa (SINEMET IR) 25-100 MG per tablet immediate release 2 tablet (not administered)     Initial Impression / Assessment and Plan / ED Course  I have reviewed the triage vital signs and the nursing notes.  Pertinent labs & imaging results that were available during my care of the patient were reviewed by me and considered in my medical decision making (see chart for details).    12:21 AM Attempted to call [facility]  where she lives, but I am unable to speak to anyone other than a voicemail This time there is no corroborating information Altered mental status workup initiated 4:24 AM Patient has remained stable emergency department, is more alert and talkative Labs and imaging are reassuring She had a recent admission and workup for strokes I do not see any signs of stroke at this time There is still very limited secondary information, EMS notes do indicate that she was found by security officer who said she was not responding and EMS was called This could be sundowning, could be related to medications No signs of infectious etiology We will continue to monitor 6:39 AM Improved, awake alert no distress answers all questions appropriately no focal weakness to suggest stroke Patient reports that she does have difficulty walking, but must take her Sinemet daily to ensure that she does not have difficulty walking She did miss her last 2 doses She requests a dose here, and she feels comfortable for discharge She will discuss with the supervisor at her facility to see if she may need to be upgraded due to her recent difficulties ambulating I suspect is due to her underlying Parkinson Final Clinical Impressions(s) / ED Diagnoses   Final diagnoses:  Parkinson disease (HCC)  Transient  alteration of awareness    ED Discharge Orders    None       Zadie RhineWickline, Hulan Szumski, MD 10/31/17 [phone]

## 2017-10-31 NOTE — ED Notes (Signed)
Attempted report again. 

## 2017-10-31 NOTE — ED Notes (Signed)
VSS. Pt assisted with eating her dysphagia tray prior to being assisted onto PTAR stretcher. Pt sitting upright on stretcher to facilitate better movement of food after eating. All belongings with PTAR. Detailed report given to PTAR with highlighted [facility]

## 2017-10-31 NOTE — ED Notes (Signed)
Pt requesting oral levodopa, pt informed a SLP swallow eval needs to be completed before giving PO meds, pt declines pain meds for cramping at this time

## 2017-10-31 NOTE — ED Notes (Signed)
Spoke with Toniann FailWendy at [facility] to see if SLP can be expedited d/t need for Levodopa med

## 2017-10-31 NOTE — ED Notes (Signed)
ED Provider at bedside. 

## 2017-10-31 NOTE — ED Notes (Signed)
Pt alert to self, situation, place, and time.

## 2017-10-31 NOTE — Clinical Social Work Note (Signed)
Clinical Social Work Assessment  Patient Details  Name: Nichole Krueger MRN: [id number] Date of Birth: [birth year]  Date of referral:  10/31/17               Reason for consult:  Facility Placement                Permission sought to share information with:    Permission granted to share information::     Name::        Agency::     Relationship::     Contact Information:     Housing/Transportation Living arrangements for the past 2 months:  Independent Living Facility([facility] per pt report. ) Source of Information:  Patient Patient Interpreter Needed:  None Criminal Activity/Legal Involvement Pertinent to Current Situation/Hospitalization:  No - Comment as needed Significant Relationships:  Adult Children, Siblings Lives with:  Facility Resident, Self Do you feel safe going back to the place where you live?  Yes Need for family participation in patient care:  Yes (Comment)  Care giving concerns:  CSW spoke with pt at bedside. At this time pt has expressed no concerns to CSW.    Social Worker assessment / plan:  CSW spoke with pt at bedside. During this time pt informed CSW [facility] Pt reports that pt has been at this facility for a little under a year. Pt reports that pt's sisters live at this facility as well and pt is wanting to return to this facility at the time of discharge. Pt reports having a son but son [place] in [place] a this time.  Employment status:  Retired Database administratornsurance information:  Managed Medicare PT Recommendations:  Not assessed at this time Information / Referral to community resources:  Skilled Nursing Facility  Patient/Family's Response to care:  Pt appears to be understanding and agreeable to plan of care at this time.   Patient/Family's Understanding of and Emotional Response to Diagnosis, Current Treatment, and Prognosis:  No further questions or concerns have been presented to CSW at this time.   Emotional Assessment Appearance:   Appears stated age Attitude/Demeanor/Rapport:    Affect (typically observed):  Pleasant Orientation:  Oriented to Self, Oriented to Situation, Oriented to Place, Oriented to  Time Alcohol / Substance use:  Not Applicable Psych involvement (Current and /or in the community):  No (Comment)  Discharge Needs  Concerns to be addressed:  No discharge needs identified Readmission within the last 30 days:  No Current discharge risk:  None Barriers to Discharge:  No Barriers Identified   Nichole Krueger, LCSWA 10/31/2017, 7:59 AM

## 2017-10-31 NOTE — Progress Notes (Signed)
CSW [facility] and confirmed that pt can come back there and be placed in the SNF portion if needed. CSW will continue to follow for PT recommendations at this time.     Claude MangesKierra S. Amaree Loisel, MSW, LCSW-A Emergency Department Clinical Social Worker [phone]

## 2017-10-31 NOTE — Discharge Planning (Signed)
Clinical Social Work is seeking post-discharge placement for this patient at the following level of care: Skilled Nursing Facility.    

## 2017-10-31 NOTE — ED Notes (Signed)
Per [place], PT, on the way down to assess pt at this time.

## 2017-10-31 NOTE — Progress Notes (Signed)
CSW spoke with Tresa EndoKelly and informed her that CSW has sent over all needed information at this time. CSW will fax over AVS and RN to call fro transport. RN to call report to [phone]. There are no further CSW needs, CSW signing off.     Claude MangesKierra S. Waller Marcussen, MSW, LCSW-A Emergency Department Clinical Social Worker [phone]

## 2017-10-31 NOTE — ED Notes (Signed)
Report attempt for the fourth time, message with call back number given to Diane, RN per operator.

## 2017-10-31 NOTE — ED Notes (Signed)
Wickline, Md notified re: failed swallow screen and pending oral meds, orders to be placed for care management

## 2017-10-31 NOTE — Plan of Care (Signed)
  Progressing SLP Dysphagia Goals Patient will utilize recommended strategies Description Patient will utilize recommended strategies during swallow to increase swallowing safety with 10/31/2017 1047 - Progressing by Wanda PlumpBueche, Otha Rickles B, CCC-SLP Flowsheets Taken 10/31/2017 1047  Patient will utilize recommended strategies during swallow to increase swallowing safety with  min assist Misc Dysphagia Goal 10/31/2017 1047 - Progressing by Wanda PlumpBueche, Philander Ake B, CCC-SLP Flowsheets Taken 10/31/2017 1047  Misc Dysphagia Goal  Pt will tolerate least restrictive diet without overt s/s aspiration or decline in respiratory status

## 2017-10-31 NOTE — NC FL2 (Signed)
[place] MEDICAID FL2 LEVEL OF CARE SCREENING TOOL     IDENTIFICATION  Patient Name: Nichole Krueger Birthdate: 01/05/1936 Sex: female Admission Date (Current Location): 10/30/2017  [facility] [place]   Facility and Address:  [facility], [address], [place], [place] [address]      Provider Number: [id number]  Attending Physician Name and Address:  Default, Provider, MD  Relative Name and Phone Number:       Current Level of Care: Hospital Recommended Level of Care: Skilled Nursing Facility Prior Approval Number:    Date Approved/Denied:   PASRR Number:   [id number]  Discharge Plan:      Current Diagnoses: Patient Active Problem List   Diagnosis Date Noted  . Impaired fasting glucose 10/15/2017  . Normocytic anemia 10/15/2017  . Pulmonary arterial hypertension (HCC) 10/15/2017  . Chest pain 10/14/2017  . Orthostatic syncope 05/06/2017  . Syncope 05/04/2017  . Muscle rigidity 01/31/2017  . AF (paroxysmal atrial fibrillation) (HCC) 01/31/2017  . Parkinson's disease (HCC) 01/31/2017    Orientation RESPIRATION BLADDER Height & Weight     Self  Normal Continent Weight:   Height:     BEHAVIORAL SYMPTOMS/MOOD NEUROLOGICAL BOWEL NUTRITION STATUS      Continent Diet(please see AVS. )  AMBULATORY STATUS COMMUNICATION OF NEEDS Skin   Limited Assist Verbally Normal                       Personal Care Assistance Level of Assistance  Bathing, Feeding, Dressing Bathing Assistance: Maximum assistance Feeding assistance: Limited assistance Dressing Assistance: Maximum assistance     Functional Limitations Info  Sight, Hearing, Speech Sight Info: Adequate Hearing Info: Adequate Speech Info: Adequate    SPECIAL CARE FACTORS FREQUENCY  Speech therapy, OT (By licensed OT), PT (By licensed PT)     PT Frequency: 5 times a week  OT Frequency: 5 times a week      Speech Therapy Frequency: 5 times a week        Contractures Contractures Info: Not present    Additional Factors Info  Code Status, Allergies Code Status Info: Prior  Allergies Info: Other, Thorazine Chlorpromazine           Current Medications (10/31/2017):  This is the current hospital active medication list Current Facility-Administered Medications  Medication Dose Route Frequency Provider Last Rate Last Dose  . baclofen (LIORESAL) tablet 10 mg  10 mg Oral TID Lavera GuiseLiu, Dana Duo, MD       Current Outpatient Medications  Medication Sig Dispense Refill  . ALPRAZolam (XANAX) 0.25 MG tablet Take 1 tablet (0.25 mg total) by mouth daily as needed for anxiety. 10 tablet 0  . apixaban (ELIQUIS) 5 MG TABS tablet Take 5 mg by mouth 2 (two) times daily.    . baclofen (LIORESAL) 10 MG tablet Take 1 tablet (10 mg total) by mouth 3 (three) times daily as needed for muscle spasms (leg muscle spasms). (Patient taking differently: Take 10 mg by mouth 3 (three) times daily. ) 15 each 0  . carbidopa-levodopa (SINEMET CR) 50-200 MG tablet Take 1 tablet by mouth at bedtime. 90 tablet 1  . carbidopa-levodopa (SINEMET IR) 25-100 MG tablet 2 tablets at 6 am, 2 tablets at 9 am, 2 tablets at 12 pm, 2 tablets at 3 pm, 1 PRN at 5 pm 270 tablet 5  . cyanocobalamin (,VITAMIN B-12,) 1000 MCG/ML injection Inject 1 mL (1,000 mcg total) into the muscle every  30 (thirty) days. 1 mL 12  . moxifloxacin (VIGAMOX) 0.5 % ophthalmic solution 1 drop 3 (three) times daily.    . Multiple Vitamin (MULTI-VITAMINS) TABS Take 1 tablet by mouth daily.    Marland Kitchen. NEEDLE, DISP, 22 G 22G X 1" MISC Use one monthly for B12 injections 10 each 1  . neomycin-polymyxin-dexameth (MAXITROL) 0.1 % OINT 1 application.    . pramipexole (MIRAPEX) 0.25 MG tablet TAKES 0.25 MG BY MOUTH THREE TIMES DAILY 270 tablet 1  . pramipexole (MIRAPEX) 1 MG tablet Take 1 tablet (1 mg total) by mouth at bedtime. 90 tablet 1  . prednisoLONE acetate (PRED FORTE) 1 % ophthalmic suspension 1 drop.    . selegiline  (ELDEPRYL) 5 MG capsule TAKES 5MG  BY MOUTH TWICE DAILY  1  . Syringe, Disposable, 1 ML MISC Use once monthly for b12 injections 25 each 0     Discharge Medications: Please see discharge summary for a list of discharge medications.  Relevant Imaging Results:  Relevant Lab Results:   Additional Information SSN- [id number]  Robb MatarKierra S Vasil Juhasz, LCSWA

## 2017-10-31 NOTE — ED Notes (Signed)
Consulted with pharmacy, can crush baclofen and administer in apple sauce for pt based on SLP eval recommendations.

## 2017-10-31 NOTE — Progress Notes (Signed)
CSW spoke with MD and was informed that MD has consulted PT/OT and other services for pt at this time. CSW will continue to follow for PT recommendation and assists with needs as needed.    Claude MangesKierra S. Izabell Schalk, MSW, LCSW-A Emergency Department Clinical Social Worker [phone]

## 2017-10-31 NOTE — ED Notes (Signed)
Lunch tray ordered. Pt updated to POC

## 2017-11-05 ENCOUNTER — Other Ambulatory Visit: Payer: Medicare Other

## 2017-11-06 ENCOUNTER — Telehealth: Payer: Self-pay | Admitting: Neurology

## 2017-11-06 NOTE — Telephone Encounter (Signed)
-----   Message from Susan R Reid sent at 11/06/2017 12:03 PM EST ----- Regarding: Amb EEG This pt has an appointment with you 12/04/17.  She rescheduled her ambulatory EEG X2 then cancelled it all together. Letting you know so you are aware of why she did not have one completed nor scheduled. Susan 

## 2017-11-06 NOTE — Telephone Encounter (Signed)
-----   Message from Vallarie MareSusan R Reid sent at 11/06/2017 12:03 PM EST ----- Regarding: Amb EEG This pt has an appointment with you 12/04/17.  She rescheduled her ambulatory EEG X2 then cancelled it all together. Letting you know so you are aware of why she did not have one completed nor scheduled. Darl PikesSusan

## 2017-11-06 NOTE — Telephone Encounter (Signed)
Noted in chart for future reference.

## 2017-11-26 ENCOUNTER — Telehealth: Payer: Self-pay | Admitting: Neurology

## 2017-11-26 NOTE — Telephone Encounter (Signed)
Britta MccreedyBarbara aware to have patient keep appt.

## 2017-11-26 NOTE — Telephone Encounter (Signed)
Britta MccreedyBarbara called needing to see if Harriett Sineancy needs to keep her next appointment with Dr. Arbutus Leasat for the 4 month Follow up or cancel it due to her canceling her EEG? Please Call. Thanks

## 2017-12-02 NOTE — Progress Notes (Signed)
Nichole Krueger was seen today in the movement disorders clinic for neurologic consultation at the request of [name], MD.  The consultation is for the evaluation of PD.  The records that were made available to me were reviewed.  Pt currently under the care of [name].  Last seen by [facility] in May, 2018.  Patient was diagnosed with Parkinson's disease in 1998.  Her first symptom was R hand tremor.  She cannot remember what her first medication was.  She states that she really did well until about 5 years ago  When she was last seen at [facility], her medication was slightly changed.  She was told to take her carbidopa/levodopa 25/100, 2 pills at 6 AM/9 AM/noon/3 PM and one pill at 6 PM and 9 PM.  This was a one pill increase but it doesn't appear that this was done and she is still on carbidopa/levodopa 25/100, 2 pills at 6 AM/9 AM/noon/3 PM and one pill at 5 PM prn.  She is also on carbidopa/levodopa 50/200 at bedtime.  She was told to take pramipexole 0.25 mg 3 times per day and then 2 tablets at bedtime; again, this does not appear to have been transcribed to the nursing facility and she is now on pramipexole 0.25 mg 3 times per day and 1 mg at bedtime.    She was to maintain selegiline 5 mg, one tablet twice per day and Aricept 5 mg nightly.  She is no longer on aricept when she returns today.  She is not completely happy with current regimen and thinks that has more dyskinesia after 9am/3pm dosage.     Specific Symptoms:  Tremor: Yes.  , occasionally (and now it can be in both hands but doesn't occur regulary) Family hx of similar:  No. (grandmother had some tremor) Voice: softer especially when tired Sleep: some trouble with sleep (may fall asleep in front of TV)  Vivid Dreams:  No.  Acting out dreams:  No. Wet Pillows: Yes.   Postural symptoms:  Yes.    Falls?  Yes.  , last one 2-3 months ago (was more of a passing out than a fall.  Doesn't recall much about event).  Has  not had other falls Bradykinesia symptoms: difficulty getting out of a chair and difficulty regaining balance Loss of smell:  Yes.   Loss of taste:  No. Urinary Incontinence:  No. Difficulty Swallowing:  Yes.   (some trouble with pills but does better with taking with bananas/applesauce) Handwriting, micrographia: Yes.   (when she wrote here it didn't reflect this but reports that it often is little) Trouble with ADL's:  Yes.   (cannot tie shoes but otherwise does dress self)  Trouble buttoning clothing: Yes.   Depression:  No. Memory changes:  Yes.  , trouble with names Hallucinations:  Yes.  , in the past she had vivid hallucination and called police.  This was few years ago and hasn't had since (this was in 2015)  visual distortions: Yes.   N/V:  No. Lightheaded:  Yes.   (occasionally)  Syncope: Yes.   (in early June she had passing out episode and fell and hit back.  Now seeing spine for LBP) Diplopia:  No. Dyskinesia:  Yes.   (has dyskinesia during "on" periods and dystonia in "off" periods).  Pt reports that dystonia manifests as inversion of ankles bilaterally and then the "cramping" will progress up the body.  She states that she uses baclofen now  for that 10 mg tid.  Neuroimaging of the brain has  previously been performed.  It is available for my review today.  CT brain done in 05/16/17 and it was unremarkable for acute process.  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  I just slightly decreased her dosages of levodopa last visit because of dyskinesia which was bothersome to her.  She ultimately did not like that change and increased her medication back to carbidopa/levodopa 25/100, to 2 pills at 6 AM/9 AM/noon/3 PM and 1 at 5 PM if she needs that.  She is also on pramipexole, 0.25 mg 3 times per day and then takes 1 mg at night.  She has been to the emergency room numerous times since last visit.  She went in early November with a discomfort and  stiffness in her abdomen.  She felt that she was having shortness of breath.  The workup in the emergency room was negative.  She went to the emergency room at the end of November with right-sided facial droop.  This apparently resolved while EMS was present and had resolved by the time she got to the hospital.  MRI of the brain was attempted, but the patient could not tolerate it.  The neuro hospitalist saw the patient and felt his suspicion for stroke was low.  He felt that her symptoms could be due to missing her levodopa earlier that day.  She called me 2 days after hospital discharge to state that she was having blackout spells.  She reported that prior to hospital admission, she was in her living room and the next thing that she knew is that people were around her trying to get her open her eyes.  She thinks that she passed out before she had the facial droop that led to the hospitalization.  Ambulatory EEG was scheduled with my office.  She was agreeable, but then canceled it.  She went back to the hospital on October 31, 2017, after being found by a security guard at her facility.  There are no other records surrounding this.  The patient was examined by the emergency room, felt that she had missed several doses of levodopa and discharge from the emergency room was recommended.  Just before discharge, she complained that she was having difficulty swallowing.  A bedside swallowing evaluation was completed, which she did not pass and a dysphagia 3 (mechanical soft) diet was recommended.  Interestingly, the patient also complained of left-sided facial tightness during this ER stay.  [facility], but to the skilled nursing facility portion instead of independent living.  She states that she is back in independent living.  She is still doing therapy.  She thinks that the therapy helps.  She is having hallucinations and that "is a real problem."  She feels that she is seeing  her son which is now deceased.  She does know it is not real.  She, however, feels that the "hallucinations are drawing me into their world."  She thinks that moods are good but admits to anxiety.    PREVIOUS MEDICATIONS: Sinemet and MirapexRytary (hallucinations); amantadine (hallucinations); stalevo (too expensive);   ALLERGIES:   Allergies  Allergen Reactions  . Other Other (See Comments)    ANESTHESIA-HALLUCINATIONS  . Thorazine [Chlorpromazine] Other (See Comments)    Unknown    CURRENT MEDICATIONS:  Outpatient Encounter Medications as of 12/04/2017  Medication Sig  . apixaban (ELIQUIS) 5 MG TABS  tablet Take 5 mg by mouth 2 (two) times daily.  . baclofen (LIORESAL) 10 MG tablet Take 1 tablet (10 mg total) by mouth 3 (three) times daily as needed for muscle spasms (leg muscle spasms). (Patient taking differently: Take 10 mg by mouth 3 (three) times daily. )  . carbidopa-levodopa (SINEMET CR) 50-200 MG tablet Take 1 tablet by mouth at bedtime.  . carbidopa-levodopa (SINEMET IR) 25-100 MG tablet 2 tablets at 6 am, 2 tablets at 9 am, 2 tablets at 12 pm, 2 tablets at 3 pm, 1 PRN at 5 pm  . cyanocobalamin (,VITAMIN B-12,) 1000 MCG/ML injection Inject 1 mL (1,000 mcg total) into the muscle every 30 (thirty) days.  . Multiple Vitamin (MULTI-VITAMINS) TABS Take 1 tablet by mouth daily.  . pramipexole (MIRAPEX) 0.25 MG tablet TAKES 0.25 MG BY MOUTH THREE TIMES DAILY  . pramipexole (MIRAPEX) 1 MG tablet Take 1 tablet (1 mg total) by mouth at bedtime.  . selegiline (ELDEPRYL) 5 MG capsule TAKES 5MG  BY MOUTH TWICE DAILY  . ALPRAZolam (XANAX) 0.25 MG tablet Take 1 tablet (0.25 mg total) by mouth daily as needed for anxiety. (Patient not taking: Reported on 12/04/2017)  . [DISCONTINUED] moxifloxacin (VIGAMOX) 0.5 % ophthalmic solution 1 drop 3 (three) times daily.  . [DISCONTINUED] NEEDLE, DISP, 22 G 22G X 1" MISC Use one monthly for B12 injections  . [DISCONTINUED] prednisoLONE acetate (PRED FORTE)  1 % ophthalmic suspension 1 drop.  . [DISCONTINUED] Syringe, Disposable, 1 ML MISC Use once monthly for b12 injections   No facility-administered encounter medications on file as of 12/04/2017.     PAST MEDICAL HISTORY:   Past Medical History:  Diagnosis Date  . Anxiety   . Atrial fibrillation (HCC)   . Macular degeneration   . Parkinson's disease (HCC)   . Pulmonary embolism (HCC)     PAST SURGICAL HISTORY:   Past Surgical History:  Procedure Laterality Date  . BACK SURGERY  2017   x3  . CATARACT EXTRACTION Right   . EXTERNAL FIXATION ARM      SOCIAL HISTORY:   Social History   Socioeconomic History  . Marital status: Widowed    Spouse name: Not on file  . Number of children: Not on file  . Years of education: Not on file  . Highest education level: Not on file  Social Needs  . Financial resource strain: Not on file  . Food insecurity - worry: Not on file  . Food insecurity - inability: Not on file  . Transportation needs - medical: Not on file  . Transportation needs - non-medical: Not on file  Occupational History  . Not on file  Tobacco Use  . Smoking status: Never Smoker  . Smokeless tobacco: Never Used  Substance and Sexual Activity  . Alcohol use: Yes    Comment: occasional wine  . Drug use: No  . Sexual activity: Not on file  Other Topics Concern  . Not on file  Social History Narrative  . Not on file    FAMILY HISTORY:   Family Status  Relation Name Status  . Mother  Deceased  . Father  Deceased  . Sister  (Not Specified)  . Son x2 Alive    ROS:  A complete 10 system review of systems was obtained and was unremarkable apart from what is mentioned above.  PHYSICAL EXAMINATION:    VITALS:   Vitals:   12/04/17 1035  BP: 136/80  Pulse: 100  SpO2: 97%  Weight:  124 lb (56.2 kg)  Height: 5\' 4"  (1.626 m)    GEN:  The patient appears stated age and is in NAD. HEENT:  Normocephalic, atraumatic.  The mucous membranes are moist. The  superficial temporal arteries are without ropiness or tenderness. CV:  RRR Lungs:  CTAB Neck/HEME:  There are no carotid bruits bilaterally. Abd:  Wearing abdominal compression binder  Neurological examination:  Orientation:  Montreal Cognitive Assessment  07/24/2017  Visuospatial/ Executive (0/5) 5  Naming (0/3) 2  Attention: Read list of digits (0/2) 2  Attention: Read list of letters (0/1) 1  Attention: Serial 7 subtraction starting at 100 (0/3) 0  Language: Repeat phrase (0/2) 2  Language : Fluency (0/1) 1  Abstraction (0/2) 2  Delayed Recall (0/5) 2  Orientation (0/6) 6  Total 23  Adjusted Score (based on education) 23   Cranial nerves: There is good facial symmetry. Pupils are equal round and reactive to light bilaterally. Fundoscopic exam reveals clear margins bilaterally. Extraocular muscles are intact. The visual fields are full to confrontational testing. The speech is fluent and clear. Soft palate rises symmetrically and there is no tongue deviation. Hearing is intact to conversational tone. Sensation: Sensation is intact to light touch x 4 Motor: Strength is at least antigravity x 4   Movement examination: Tone: There is normal tone in the bilateral upper extremities.  The tone in the lower extremities is normal.  Abnormal movements: There is mod to severe dyskinesia, both axially and in the limbs (same as last visit) Coordination:  There is good RAMs with any form of RAMS, including alternating supination and pronation of the forearm, hand opening and closing, finger taps, heel taps and toe taps.. Gait and Station: The patient has difficulty arising out of a deep-seated chair without the use of the hands but she easily pushes off the chair. The patient's stride length is very normal with the use of a walker; however, because of hand dyskinesia she is constantly having to readjust and move the hands off of the walker and grasp on it is poor.  Labs:  Her B12 on 04/02/2017  at [facility] was 290    ASSESSMENT/PLAN:  1.  Idiopathic Parkinson's disease.  The patient was dx in 1998.    -while patient has done incredibly well, she is bothered by on dyskinesia and off dystonia.  Amantadine has caused hallucinations in the past.  They have talked to her extensively about DBS therapy as well as duopa.  We addressed both of these topics here today.  She doesn't feel that she could handle mentally DBS and doesn't think that she could physically handle the duopa tube.  She has been previously given literature on both of these topics.  We talked about the pump patch system in trial.  We also discussed inbrija, which also isn't on the market yet.  -I think that I need to wean her off of the pramipexole which she is currently on, 0.25 mg 3 times per day and 1 mg at bedtime.  She is having a number of hallucinations.  She was agreeable and will wean her down over the next 3-4 weeks.  -She will continue carbidopa/levodopa 50/200 at bedtime.   -We discussed that it used to be thought that levodopa would increase risk of melanoma but now it is believed that Parkinsons itself likely increases risk of melanoma. she is to get regular skin checks.  -We discussed community resources in the area including patient support groups and community exercise  programs for PD and pt education was provided to the patient.  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  She has good support at [facility] and is engaged there and receiving therapy there.  2.  B12 deficiency  -Injections were recommended at [facility].  She is on B12 injections now and just had one this week.   3.  Facial droop  -The records that were made available to me were reviewed and thus far, work up for stroke negative and when she has this she has missed her carbidopa/levodopa.  I wonder if this represents off dystonia.  The neurohospitalist reported low  suspicion for stroke  4.  Follow up is anticipated in the next few months, sooner should new neurologic issues arise.  Much greater than 50% of this visit was spent in counseling and coordinating care.  Total face to face time:  25 min.  This did not include the 40 min of record review which was detailed above, which was non face to face time   Cc:  [name], MD

## 2017-12-04 ENCOUNTER — Ambulatory Visit (INDEPENDENT_AMBULATORY_CARE_PROVIDER_SITE_OTHER): Payer: Medicare Other | Admitting: Neurology

## 2017-12-04 ENCOUNTER — Encounter: Payer: Self-pay | Admitting: Neurology

## 2017-12-04 VITALS — BP 136/80 | HR 100 | Ht 64.0 in | Wt 124.0 lb

## 2017-12-04 DIAGNOSIS — E538 Deficiency of other specified B group vitamins: Secondary | ICD-10-CM | POA: Diagnosis not present

## 2017-12-04 DIAGNOSIS — G20 Parkinson's disease: Secondary | ICD-10-CM | POA: Diagnosis not present

## 2017-12-04 DIAGNOSIS — R441 Visual hallucinations: Secondary | ICD-10-CM | POA: Diagnosis not present

## 2017-12-04 NOTE — Patient Instructions (Signed)
1. Stop Pramipexole as follows:   Take Pramipexole 0.25 mg 1 tablet in the morning, 1 tablet in the afternoon, 2 tablets at bedtime for one week, Then take Pramipexole 0.25 mg - 1 tablet in the morning, 1 tablet at bedtime for one week,  Then take Pramipexole 0.25 mg - 1 tablet at bedtime for one week  Then stop

## 2017-12-06 ENCOUNTER — Encounter (HOSPITAL_COMMUNITY): Payer: Self-pay

## 2017-12-06 ENCOUNTER — Emergency Department (HOSPITAL_COMMUNITY): Payer: Medicare Other

## 2017-12-06 ENCOUNTER — Other Ambulatory Visit: Payer: Self-pay

## 2017-12-06 ENCOUNTER — Emergency Department (HOSPITAL_COMMUNITY)
Admission: EM | Admit: 2017-12-06 | Discharge: 2017-12-06 | Disposition: A | Discharge status: Home / Self Care | Payer: Medicare Other | Attending: Emergency Medicine | Admitting: Emergency Medicine

## 2017-12-06 DIAGNOSIS — W19XXXA Unspecified fall, initial encounter: Secondary | ICD-10-CM

## 2017-12-06 DIAGNOSIS — Y939 Activity, unspecified: Secondary | ICD-10-CM | POA: Insufficient documentation

## 2017-12-06 DIAGNOSIS — S79912A Unspecified injury of left hip, initial encounter: Secondary | ICD-10-CM | POA: Insufficient documentation

## 2017-12-06 DIAGNOSIS — Y92122 Bedroom in nursing home as the place of occurrence of the external cause: Secondary | ICD-10-CM | POA: Diagnosis not present

## 2017-12-06 DIAGNOSIS — Z7901 Long term (current) use of anticoagulants: Secondary | ICD-10-CM | POA: Insufficient documentation

## 2017-12-06 DIAGNOSIS — G20 Parkinson's disease: Secondary | ICD-10-CM | POA: Diagnosis not present

## 2017-12-06 DIAGNOSIS — Z79899 Other long term (current) drug therapy: Secondary | ICD-10-CM | POA: Insufficient documentation

## 2017-12-06 DIAGNOSIS — W06XXXA Fall from bed, initial encounter: Secondary | ICD-10-CM | POA: Diagnosis not present

## 2017-12-06 DIAGNOSIS — Y999 Unspecified external cause status: Secondary | ICD-10-CM | POA: Diagnosis not present

## 2017-12-06 DIAGNOSIS — I480 Paroxysmal atrial fibrillation: Secondary | ICD-10-CM | POA: Diagnosis not present

## 2017-12-06 LAB — CBC WITH DIFFERENTIAL/PLATELET
BASOS PCT: 0 %
Basophils Absolute: 0 10*3/uL (ref 0.0–0.1)
EOS ABS: 0 10*3/uL (ref 0.0–0.7)
EOS PCT: 0 %
HCT: 33 % — ABNORMAL LOW (ref 36.0–46.0)
Hemoglobin: 11 g/dL — ABNORMAL LOW (ref 12.0–15.0)
LYMPHS ABS: 0.8 10*3/uL (ref 0.7–4.0)
Lymphocytes Relative: 19 %
MCH: 33.2 pg (ref 26.0–34.0)
MCHC: 33.3 g/dL (ref 30.0–36.0)
MCV: 99.7 fL (ref 78.0–100.0)
MONO ABS: 0.3 10*3/uL (ref 0.1–1.0)
MONOS PCT: 8 %
NEUTROS PCT: 73 %
Neutro Abs: 3.2 10*3/uL (ref 1.7–7.7)
PLATELETS: 184 10*3/uL (ref 150–400)
RBC: 3.31 MIL/uL — ABNORMAL LOW (ref 3.87–5.11)
RDW: 14 % (ref 11.5–15.5)
WBC: 4.3 10*3/uL (ref 4.0–10.5)

## 2017-12-06 LAB — BASIC METABOLIC PANEL
Anion gap: 9 (ref 5–15)
BUN: 13 mg/dL (ref 6–20)
CALCIUM: 8.7 mg/dL — AB (ref 8.9–10.3)
CHLORIDE: 106 mmol/L (ref 101–111)
CO2: 24 mmol/L (ref 22–32)
CREATININE: 0.61 mg/dL (ref 0.44–1.00)
GFR calc non Af Amer: >60 mL/min (ref >60)
Glucose, Bld: 105 mg/dL — ABNORMAL HIGH (ref 65–99)
Potassium: 3.5 mmol/L (ref 3.5–5.1)
SODIUM: 139 mmol/L (ref 135–145)

## 2017-12-06 NOTE — Discharge Instructions (Signed)
It was my pleasure taking care of you today!   Fortunately your labs and imaging today were very reassuring.  We did incidentally find that your surgical rod placed on the right side of your pelvis has broken. This does not appear to have occurred today. You should follow up with your orthopedist for this.   Return to ER for new or worsening symptoms, any additional concerns.

## 2017-12-06 NOTE — ED Notes (Addendum)
Attempted to call [facility] to [facility] to give [facility]  X3. [facility] called for transport.

## 2017-12-06 NOTE — ED Notes (Signed)
Patient transported to CT 

## 2017-12-06 NOTE — ED Notes (Signed)
Pt ambulated to bathroom w/ walker x1 standby assist. Pt ambulated with steady gait. Pt has no c/o.

## 2017-12-06 NOTE — ED Notes (Signed)
Patient returned from CT

## 2017-12-06 NOTE — ED Triage Notes (Signed)
Pt was found in floor by staff this morning. PT does not know if she fell. Endorses back pain. Denies head or neck pain. Pt takes eliquis. Pt reports having frequent hallucinations. Pain to left eye. Alert and oriented x 3  18 LAC 155/76 Hr 90 spo2 95% RA cbg 165 rr 18

## 2017-12-06 NOTE — ED Provider Notes (Signed)
[facility] EMERGENCY DEPARTMENT Provider Note   CSN: [id number] Arrival date & time: 12/06/17  4098     History   Chief Complaint Chief Complaint  Patient presents with  . Fall    HPI Helmi Hechavarria is a 82 y.o. female.  The history is provided by the patient and medical records. No language interpreter was used.  Fall    Keshonda Monsour is a 82 y.o. female  with a PMH of Parkinson's, afib on Eliquis who presents to the Emergency Department from facility after being found on the floor this morning. She does not remember fall. She endorses left hip pain which is new. No headache, neck pain, numbness, tingling, weakness, chest pain, shortness of breath. No medications taken prior to arrival for symptoms.   Past Medical History:  Diagnosis Date  . Anxiety   . Atrial fibrillation (HCC)   . Macular degeneration   . Parkinson's disease (HCC)   . Pulmonary embolism [facility])     Patient Active Problem List   Diagnosis Date Noted  . Impaired fasting glucose 10/15/2017  . Normocytic anemia 10/15/2017  . Pulmonary arterial hypertension (HCC) 10/15/2017  . Chest pain 10/14/2017  . Orthostatic syncope 05/06/2017  . Syncope 05/04/2017  . Muscle rigidity 01/31/2017  . AF (paroxysmal atrial fibrillation) (HCC) 01/31/2017  . Parkinson's disease (HCC) 01/31/2017    Past Surgical History:  Procedure Laterality Date  . BACK SURGERY  2017   x3  . CATARACT EXTRACTION Right   . EXTERNAL FIXATION ARM      OB History    No data available       Home Medications    Prior to Admission medications   Medication Sig Start Date End Date Taking? Authorizing Provider  ALPRAZolam (XANAX) 0.25 MG tablet Take 1 tablet (0.25 mg total) by mouth daily as needed for anxiety. 10/15/17  Yes Sheikh, Omair Latif, DO  apixaban (ELIQUIS) 5 MG TABS tablet Take 5 mg by mouth 2 (two) times daily.   Yes [provider]  baclofen (LIORESAL) 10 MG tablet Take 1 tablet (10 mg  total) by mouth 3 (three) times daily as needed for muscle spasms (leg muscle spasms). Patient taking differently: Take 10 mg by mouth 3 (three) times daily.  02/01/17  Yes Rolly Salter, MD  carbidopa-levodopa (SINEMET CR) 50-200 MG tablet Take 1 tablet by mouth at bedtime. 08/25/17  Yes Tat, Octaviano Batty, DO  carbidopa-levodopa (SINEMET IR) 25-100 MG tablet 2 tablets at 6 am, 2 tablets at 9 am, 2 tablets at 12 pm, 2 tablets at 3 pm, 1 PRN at 5 pm 08/26/17  Yes Tat, Lurena Joiner S, DO  cyanocobalamin (,VITAMIN B-12,) 1000 MCG/ML injection Inject 1 mL (1,000 mcg total) into the muscle every 30 (thirty) days. 07/24/17  Yes Tat, Octaviano Batty, DO  Multiple Vitamin (MULTI-VITAMINS) TABS Take 1 tablet by mouth daily. 05/14/11  Yes [provider]  pramipexole (MIRAPEX) 0.25 MG tablet TAKES 0.25 MG BY MOUTH THREE TIMES DAILY Patient taking differently: Take 0.25 mg by mouth See admin instructions. TAKE 1 TABLET ( 0.25 MG TOTALLY)  BY MOUTH ONE DAILY AT 9 AM 08/25/17  Yes Tat, Octaviano Batty, DO  pramipexole (MIRAPEX) 1 MG tablet Take 1 tablet (1 mg total) by mouth at bedtime. 08/25/17  Yes Tat, Octaviano Batty, DO  selegiline (ELDEPRYL) 5 MG capsule TAKE 1 TABLET ( 5MG  TOTALLY) BY MOUTH TWICE DAILY AT 9 AM AND 9PM 01/03/17  Yes [provider]  Family History Family History  Problem Relation Age of Onset  . Stroke Mother   . Heart attack Father   . Heart attack Sister     Social History Social History   Tobacco Use  . Smoking status: Never Smoker  . Smokeless tobacco: Never Used  Substance Use Topics  . Alcohol use: Yes    Comment: occasional wine  . Drug use: No     Allergies   Other and Thorazine [chlorpromazine]   Review of Systems Review of Systems  Musculoskeletal: Positive for back pain. Negative for neck pain.  All other systems reviewed and are negative.    Physical Exam Updated Vital Signs BP 122/73   Pulse 80   Temp 98.4 F (36.9 C) (Oral)   Resp 20   Ht 5\' 4"  (1.626 m)    Wt 51.7 kg (114 lb)   SpO2 99%   BMI 19.57 kg/m   Physical Exam  Constitutional: She is oriented to person, place, and time. She appears well-developed and well-nourished. No distress.  HENT:  Head: Normocephalic and atraumatic.  Neck:  + midline cervical tenderness. Full ROM.  Cardiovascular: Normal rate, regular rhythm and normal heart sounds.  No murmur heard. Pulmonary/Chest: Effort normal and breath sounds normal. No respiratory distress.  Abdominal: Soft. She exhibits no distension. There is no tenderness.  Musculoskeletal:  Tenderness to palpation of left lateral hip. No overlying skin changes. Full ROM without pain. No midline T/L spine tenderness.  Neurological: She is alert and oriented to person, place, and time.  A&Ox4. Speech clear and goal oriented. CN 2-12 grossly intact. Strength and sensation intact.  Skin: Skin is warm and dry.  Nursing note and vitals reviewed.    ED Treatments / Results  Labs (all labs ordered are listed, but only abnormal results are displayed) Labs Reviewed  CBC WITH DIFFERENTIAL/PLATELET - Abnormal; Notable for the following components:      Result Value   RBC 3.31 (*)    Hemoglobin 11.0 (*)    HCT 33.0 (*)    All other components within normal limits  BASIC METABOLIC PANEL - Abnormal; Notable for the following components:   Glucose, Bld 105 (*)    Calcium 8.7 (*)    All other components within normal limits    EKG  EKG Interpretation  Date/Time:  Saturday December 06 2017 11:32:51 EST Ventricular Rate:  81 PR Interval:    QRS Duration: 75 QT Interval:  382 QTC Calculation: 444 R Axis:   23 Text Interpretation:  Sinus rhythm Atrial premature complex Confirmed by Rolland Porter ([id number]) on 12/06/2017 12:06:16 PM       Radiology Ct Head Wo Contrast  Result Date: 12/06/2017 CLINICAL DATA:  Fall, injury, pain EXAM: CT HEAD WITHOUT CONTRAST CT CERVICAL SPINE WITHOUT CONTRAST TECHNIQUE: Multidetector CT imaging of the head and  cervical spine was performed following the standard protocol without intravenous contrast. Multiplanar CT image reconstructions of the cervical spine were also generated. COMPARISON:  10/31/2017, 05/04/2017 FINDINGS: CT HEAD FINDINGS Brain: No evidence of acute infarction, hemorrhage, hydrocephalus, extra-axial collection or mass lesion/mass effect. Vascular: No hyperdense vessel or unexpected calcification. Skull: Normal. Negative for fracture or focal lesion. Sinuses/Orbits: Minor sinus mucosal thickening bilaterally. No sinus opacification or air-fluid level. Orbits unremarkable. Other: None. CT CERVICAL SPINE FINDINGS Alignment: Normal. Skull base and vertebrae: No acute fracture. No primary bone lesion or focal pathologic process. Soft tissues and spinal canal: No prevertebral fluid or swelling. No visible canal hematoma. Disc levels: Moderately  severe degenerative spondylosis spanning C4-C7. These levels demonstrate marked disc space narrowing, sclerosis and osteophytes. Multilevel facet arthropathy posteriorly. No subluxation or dislocation. Degenerative changes of the C1-2 articulation as well. Upper chest: Negative. Other: None. IMPRESSION: Normal head CT without contrast for age. No acute cervical spine fracture or malalignment Cervical degenerative spondylosis as above. Electronically Signed   By: Judie PetitM.  Shick M.D.   On: 12/06/2017 11:48   Ct Cervical Spine Wo Contrast  Result Date: 12/06/2017 CLINICAL DATA:  Fall, injury, pain EXAM: CT HEAD WITHOUT CONTRAST CT CERVICAL SPINE WITHOUT CONTRAST TECHNIQUE: Multidetector CT imaging of the head and cervical spine was performed following the standard protocol without intravenous contrast. Multiplanar CT image reconstructions of the cervical spine were also generated. COMPARISON:  10/31/2017, 05/04/2017 FINDINGS: CT HEAD FINDINGS Brain: No evidence of acute infarction, hemorrhage, hydrocephalus, extra-axial collection or mass lesion/mass effect. Vascular: No  hyperdense vessel or unexpected calcification. Skull: Normal. Negative for fracture or focal lesion. Sinuses/Orbits: Minor sinus mucosal thickening bilaterally. No sinus opacification or air-fluid level. Orbits unremarkable. Other: None. CT CERVICAL SPINE FINDINGS Alignment: Normal. Skull base and vertebrae: No acute fracture. No primary bone lesion or focal pathologic process. Soft tissues and spinal canal: No prevertebral fluid or swelling. No visible canal hematoma. Disc levels: Moderately severe degenerative spondylosis spanning C4-C7. These levels demonstrate marked disc space narrowing, sclerosis and osteophytes. Multilevel facet arthropathy posteriorly. No subluxation or dislocation. Degenerative changes of the C1-2 articulation as well. Upper chest: Negative. Other: None. IMPRESSION: Normal head CT without contrast for age. No acute cervical spine fracture or malalignment Cervical degenerative spondylosis as above. Electronically Signed   By: Judie PetitM.  Shick M.D.   On: 12/06/2017 11:48   Dg Hip Unilat W Or Wo Pelvis 2-3 Views Left  Result Date: 12/06/2017 CLINICAL DATA:  Fall and left hip pain. EXAM: DG HIP (WITH OR WITHOUT PELVIS) 2-3V LEFT COMPARISON:  None. FINDINGS: Surgical fixation hardware in the lumbar spine and pelvis with multiple screws and rods. Fixation rod in the right pelvis is broken and this is of unknown chronicity. Pelvic bony structures are intact. Left hip is located without acute fracture. Pubic rami are intact. IMPRESSION: No acute bone abnormality in the pelvis or left hip. Surgical fixation in the lumbar spine and pelvis. Surgical rod along the right side of the pelvis is broken but the age of this is unknown. Electronically Signed   By: Richarda OverlieAdam  Henn M.D.   On: 12/06/2017 11:06    Procedures Procedures (including critical care time)  Medications Ordered in ED Medications - No data to display   Initial Impression / Assessment and Plan / ED Course  I have reviewed the triage  vital signs and the nursing notes.  Pertinent labs & imaging results that were available during my care of the patient were reviewed by me and considered in my medical decision making (see chart for details).    Iver Nestleancy Bridgeforth is a 82 y.o. female who presents to ED after being found down by bed this morning. No focal neuro deficits on exam. CT head / cervical negative. Did have tenderness to the left lateral hip. X-ray with no acute findings. Plain film does also show that a surgical rod along the right pelvis is broken but ago of this is unknown. Patient has no tenderness along this area. She has been able to ambulate in ED without discomfort. Suspect this is old - will have her follow up with orthopedics. EKG and labs reassuring. Evaluation does not show pathology  that would require ongoing emergent intervention or inpatient treatment. All questions answered.   Patient discussed with Dr. Fayrene Fearing who agrees with treatment plan.    Final Clinical Impressions(s) / ED Diagnoses   Final diagnoses:  Fall, initial encounter    ED Discharge Orders    None       Willamina Grieshop, Chase Picket, PA-C 12/06/17 1428    Rolland Porter, MD 12/07/17 2145

## 2017-12-16 ENCOUNTER — Telehealth: Payer: Self-pay | Admitting: Neurology

## 2017-12-16 NOTE — Telephone Encounter (Signed)
Sharene Skeansancy Davis called from [facility] to let you know that the [facility] has stopped giving Nichole Krueger her Sinemet 25- 100 at 5:00 PM. She wanted you to be aware. Thanks

## 2017-12-16 NOTE — Telephone Encounter (Signed)
Tat patient. 

## 2017-12-17 ENCOUNTER — Telehealth: Payer: Self-pay | Admitting: Neurology

## 2017-12-17 NOTE — Telephone Encounter (Signed)
Nichole Krueger made aware.

## 2017-12-17 NOTE — Telephone Encounter (Signed)
[facility] called 12/16/17 regarding Nichole Krueger not being given her Sinemet medication unless needed. They would like you to call to confirm that is ok? Thanks

## 2017-12-17 NOTE — Telephone Encounter (Signed)
Spoke with [facility]ole Krueger at [facility]. She states that she fills patient's medications for her. Patient was temporarily in the nursing/rehab area but now back in independent living. She does have her pill boxes filled and given to her every day, but she manages meds on her own. She states patient is good at managing medications and does take her Sinemet every day at 6 am, 9 am, 12 pm, and 3 pm. She states she has been getting a 5 pm dose to take PRN but she only takes it about half the time.  When she was in the nursing area this dose was d/c, so if you want her to continue that then they will need a new order sent.  Please advise.

## 2017-12-17 NOTE — Telephone Encounter (Signed)
Ok.  As I said yesterday, it is fine to d/c the 5 pm dose.  I thought that they were saying all other dosages were d/c.

## 2017-12-17 NOTE — Telephone Encounter (Signed)
noted 

## 2017-12-17 NOTE — Telephone Encounter (Signed)
No, that is not okay.  They called yesterday to state that they d/c the 5pm dose, which was okay.  Its not okay to d/c all dosages and give prn.  Who is managing this?

## 2017-12-19 NOTE — Telephone Encounter (Signed)
They called back asking for order for 5 pm PRN Levodopa. Order faxed to [phone] with confirmation received.

## 2017-12-26 ENCOUNTER — Telehealth: Payer: Self-pay | Admitting: Neurology

## 2017-12-26 NOTE — Telephone Encounter (Signed)
Sharene SkeansNancy Davis from [facility] called and needs to speak with you regarding this patient. Please Call. Thanks

## 2017-12-26 NOTE — Telephone Encounter (Signed)
Spoke with patient's nurse who states she is having episodes of tightness in her diaphragm and shortness of breath along with not being able to move.   Made her aware we did speak to her in November re: this and let patient know SOB is not associated with her Parkinson's Disease and advised an evaluation by her PCP. Also scheduled an EEG which the patient later cancelled. She will see if patient will r/s EEG and contact her PCP.

## 2018-01-14 ENCOUNTER — Telehealth: Payer: Self-pay | Admitting: Neurology

## 2018-01-14 NOTE — Telephone Encounter (Signed)
Tried to call number provided but number is not in service. Called number from previous encounter and they gave me her new contact (she has moved in facility). [phone]. But was told at the number that I needed to call the nursing supervisor at [phone]. Called this number provided and was disconnected x2.

## 2018-01-14 NOTE — Telephone Encounter (Signed)
Facility called and wanted to let Dr. Arbutus Leasat know that patient has been having  unresponsive episodes. Even with a sternal rub there is little response. They did say that her Vitals are good. She is scheduled for tomorrow 01/15/18 for an EEG. Thanks

## 2018-01-14 NOTE — Telephone Encounter (Signed)
Is she off pramipexole?  Have they done orthostatics on her?  May need cardiology work up if everything neg

## 2018-01-14 NOTE — Telephone Encounter (Signed)
Patient was supposed to have EEG in December 2018 and cancelled. EEG would probably be first step. Dr. Arbutus LeasatLorain Childes- FYI.

## 2018-01-15 ENCOUNTER — Ambulatory Visit (INDEPENDENT_AMBULATORY_CARE_PROVIDER_SITE_OTHER): Payer: Medicare Other | Admitting: Neurology

## 2018-01-15 DIAGNOSIS — R413 Other amnesia: Secondary | ICD-10-CM | POA: Diagnosis not present

## 2018-01-18 NOTE — Procedures (Signed)
ELECTROENCEPHALOGRAM REPORT  Dates of Recording: 01/15/2018 12:20PM to 01/16/2018 12:33PM  Patient's Name: Nichole Krueger MRN: [id number] Date of Birth: [birth year]  Referring Provider: Dr. Lurena Joinerebecca Tat  Procedure: 24-hour ambulatory EEG  History: This is a 82 year old woman with episodes of "blackout spells" and episodes of unresponsiveness.  Medications:  ELIQUIS 5 MG TABS tablet LIORESAL 10 MG tablet  SINEMET CR 50-200 MG tablet SINEMET IR 25-100 MG tablet  VITAMIN B-12 1000 MCG/ML injection  MULTI-VITAMINS TABS MIRAPEX 0.25 MG tablet MIRAPEX 1 MG tablet  ELDEPRYL 5 MG capsule  XANAX 0.25 MG tablet   Technical Summary: This is a 24-hour multichannel digital EEG recording measured by the international 10-20 system with electrodes applied with paste and impedances below 5000 ohms performed as portable with EKG monitoring.  The digital EEG was referentially recorded, reformatted, and digitally filtered in a variety of bipolar and referential montages for optimal display.    DESCRIPTION OF RECORDING: During maximal wakefulness, the background activity consisted of a symmetric 9-9.5 Hz posterior dominant rhythm which was reactive to eye opening.  There were no epileptiform discharges or focal slowing seen in wakefulness.  During the recording, the patient progresses through wakefulness, drowsiness, and Stage 2 sleep.  Again, there were no epileptiform discharges seen.  Events: There were no push button events. Patient and caregiver did not fill out diary.  There were no electrographic seizures seen.  EKG lead was unremarkable.  IMPRESSION: This 24-hour ambulatory EEG study is normal.    CLINICAL CORRELATION: A normal EEG does not exclude a clinical diagnosis of epilepsy. Typical events were not captured. If further clinical questions remain, inpatient video EEG monitoring may be helpful.   Patrcia DollyKaren Kalonji Zurawski, M.D.

## 2018-01-19 ENCOUNTER — Telehealth: Payer: Self-pay | Admitting: Neurology

## 2018-01-19 DIAGNOSIS — R55 Syncope and collapse: Secondary | ICD-10-CM

## 2018-01-19 NOTE — Telephone Encounter (Signed)
-----   Message from Octaviano Battyebecca S Tat, DO sent at 01/19/2018  7:27 AM EST ----- Her ambulatory EEG is negative.  If happens again, take BP.  Also I would recommend cardiac w/u for syncope/arrhythmia

## 2018-01-19 NOTE — Telephone Encounter (Signed)
Left message on machine for patient to call back.

## 2018-01-20 ENCOUNTER — Telehealth: Payer: Self-pay | Admitting: Neurology

## 2018-01-20 NOTE — Telephone Encounter (Signed)
Referral entered. They will call patient.

## 2018-01-20 NOTE — Telephone Encounter (Signed)
Spoke with patient. She did have cardiac workup at [facility] in July last year (notes in care everywhere) should she go back?   I did let her know it did not show neurologic reason for these spells.

## 2018-01-20 NOTE — Telephone Encounter (Signed)
It looks like that visit was for PAF and not for syncope/unresponsive episodes.  So, yes she should as I have no neurologic reason at this point for the spells

## 2018-01-20 NOTE — Telephone Encounter (Signed)
Pt left a VM message saying she needed to explain more on the muscular

## 2018-01-21 NOTE — Telephone Encounter (Signed)
I did already speak with patient about this issue. She states having muscle cramps and Baclofen not helping. This was prescribed by another MD. [facility] to call me back if needed.

## 2018-01-23 ENCOUNTER — Telehealth: Payer: Self-pay | Admitting: Neurology

## 2018-01-23 NOTE — Telephone Encounter (Signed)
Adel with [facility] called in regards to pt's medication carbadopa and that pt is requesting an additional PRN dose at night time, please call and advise  CB# [phone]

## 2018-01-23 NOTE — Telephone Encounter (Signed)
Spoke with Adel with [facility] and she states patient wants an extra Levodopa. She is already receiving PRN Levodopa at 5 pm. She did not give a reason for needing this medication. Aware we will keep dose the same until she is seen and Dr. Arbutus Leasat can discuss with her. She has had a lot going on and needs to see Cardiology.

## 2018-02-03 ENCOUNTER — Telehealth: Payer: Self-pay | Admitting: Neurology

## 2018-02-03 NOTE — Telephone Encounter (Signed)
Spoke with Misty StanleyLisa, Environmental education officerN supervisor, she states medication has not been changed and it was signed off that it has been given to patient. Right after she called, medication was given to patient (at 12:30 pm, it is scheduled for 12 pm). I made her aware if she is having trouble getting meds on time she should contact the supervisor there, as there is not a lot we can do about that from our office. We have written the medication order with times. She expressed understanding.

## 2018-02-03 NOTE — Telephone Encounter (Signed)
Patient states that she was told her medication had changed by [facility] and she wants to talk to someone ASAP. She has not had her medication since this morning. She states that she takes carbidopa levodopa and she could not remember all of the others we would know. They told her that she could not have her medication until 3 this afternoon. Patient states that she would not make it until 3 without her medication.    I called to let Lesly RubensteinJade know that I put the message back there since  The patient states that it is urgent that she speaks to [place].

## 2018-02-16 ENCOUNTER — Telehealth: Payer: Self-pay | Admitting: Neurology

## 2018-02-16 NOTE — Telephone Encounter (Signed)
Patient wants to talk to someone about what is going on with her due to the muscle atrophy. She states that her muscles get so tight that it is hard to breath at times. It does not last long but does scare her. Please call after 3:00 she is going to be out for a little bit

## 2018-02-17 NOTE — Telephone Encounter (Signed)
Spoke with patient. She is still having "episodes" where she feels tightness in her chest/diaphragm and has shortness of breath. These have been evaluated in the past (she has had eeg's, etc) and she was advised to see Cardiology. We placed a referral but it looks like they were unable to reach patient to schedule. She was given their number [phone] to call them directly to set up an appt.   She states Dr. Pete GlatterStoneking has added Xanax at night time and it is helping her stay calm and sleep. She will keep appt in May.

## 2018-02-24 IMAGING — CT CT HEAD W/O CM
1 series · 16 of 30 positions shown, 20 images · non-contrast
Comparison: 05/04/2017

CLINICAL DATA: Hx of parkinsons Hx of fall [DATE] fell at nursing
facility Frontal headache Prev ct 05/04/17 [facility] 7094

EXAM:
CT HEAD WITHOUT CONTRAST
TECHNIQUE: Contiguous axial images were obtained from the base of the skull
through the vertex without intravenous contrast.

[Series 2: head w/(date) · axial · 0.46mm/px · z∈[-125,+20]mm · 16 of 33 slices shown, 20 images]
[im 2/33  brain]
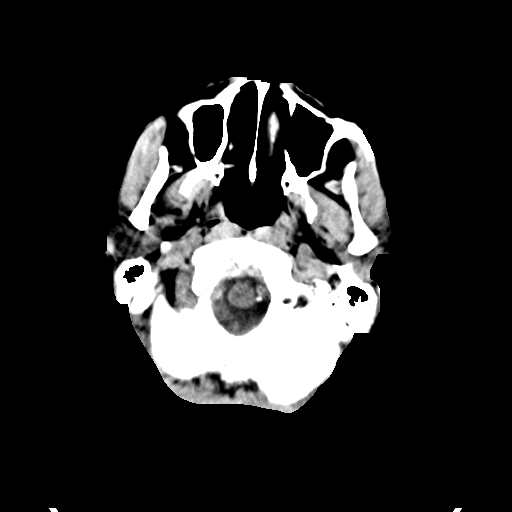
[im 2/33  bone]
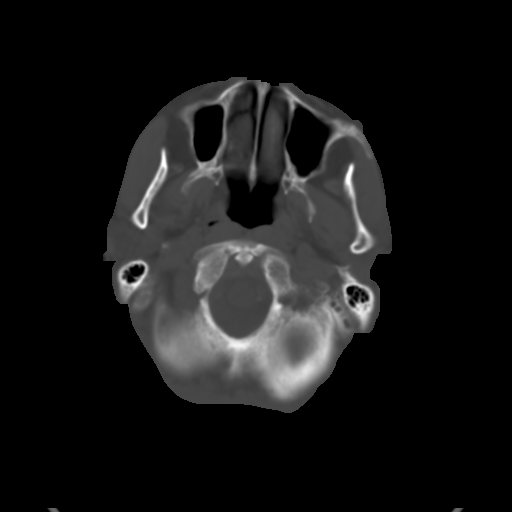
[im 4/33  brain]
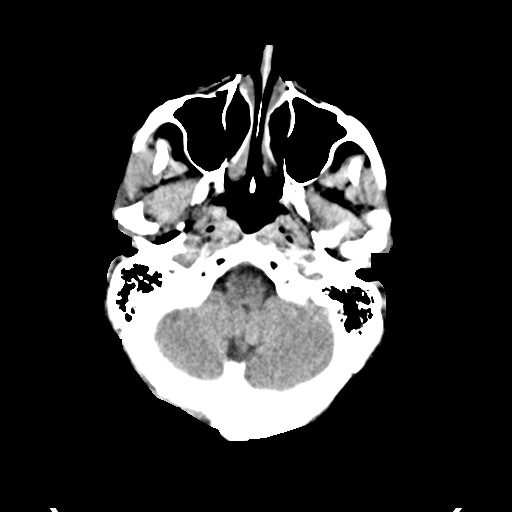
[im 6/33  brain]
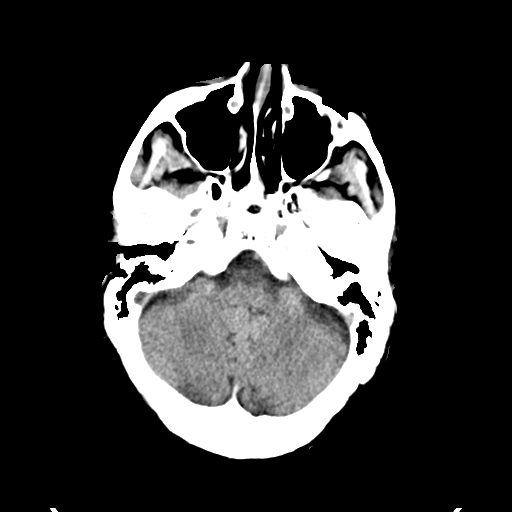
[im 8/33  brain]
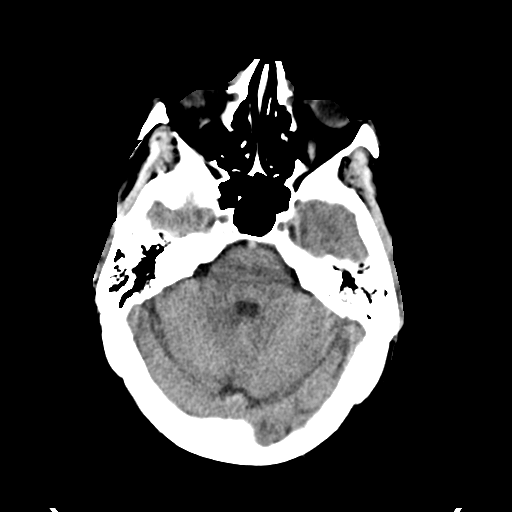
[im 9/33  brain]
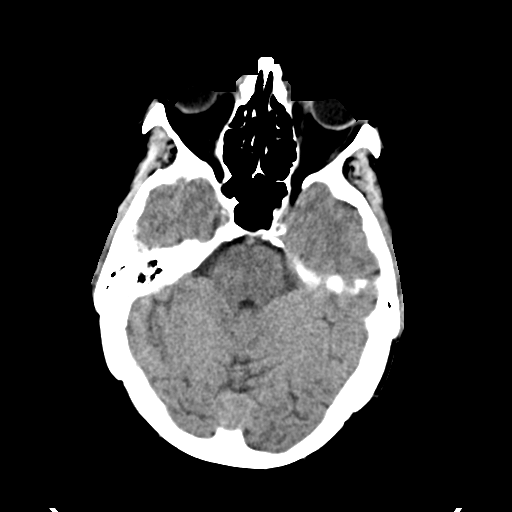
[im 9/33  bone]
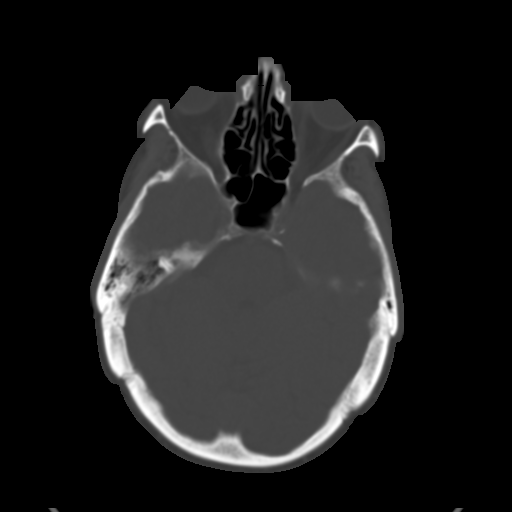
[im 12/33  brain]
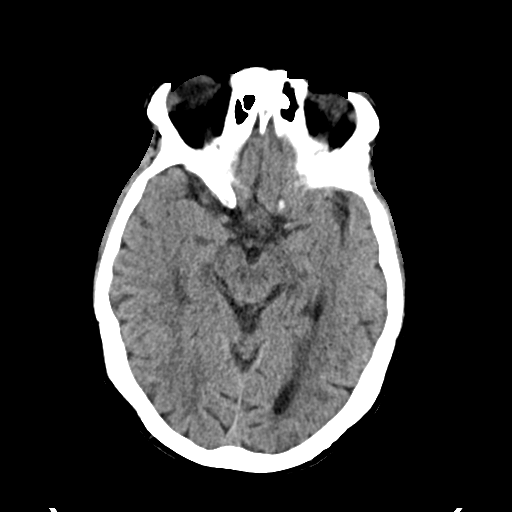
[im 14/33  brain]
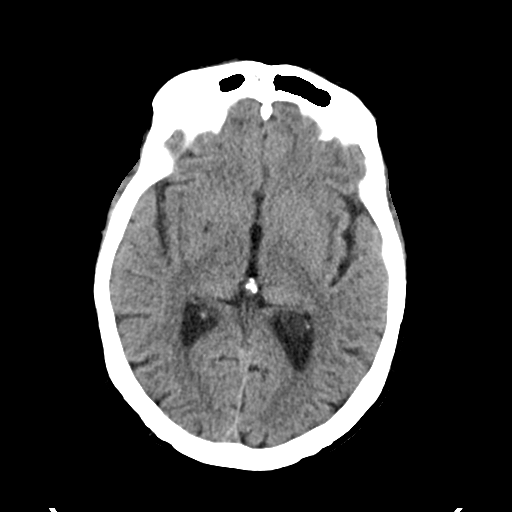
[im 16/33  brain]
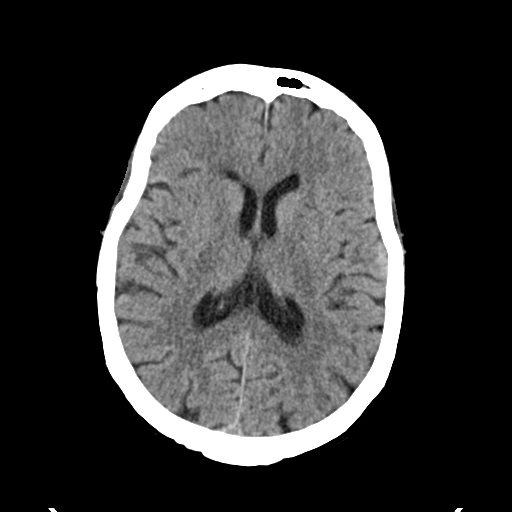
[im 17/33  brain]
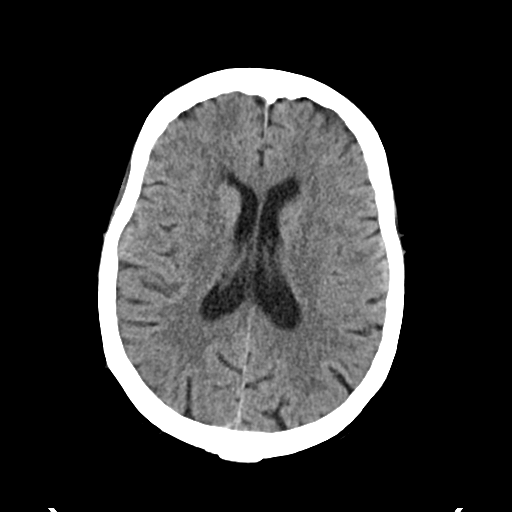
[im 17/33  bone]
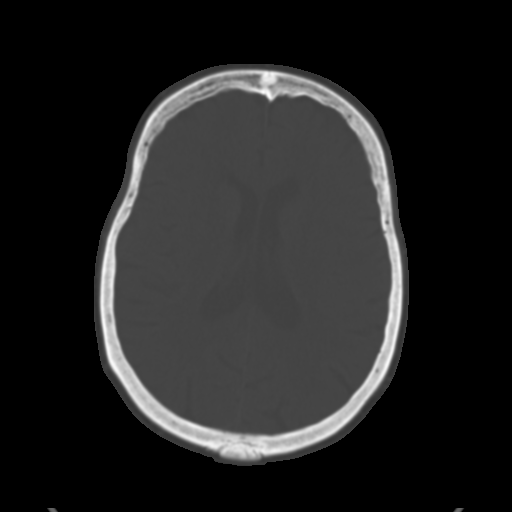
[im 19/33  brain]
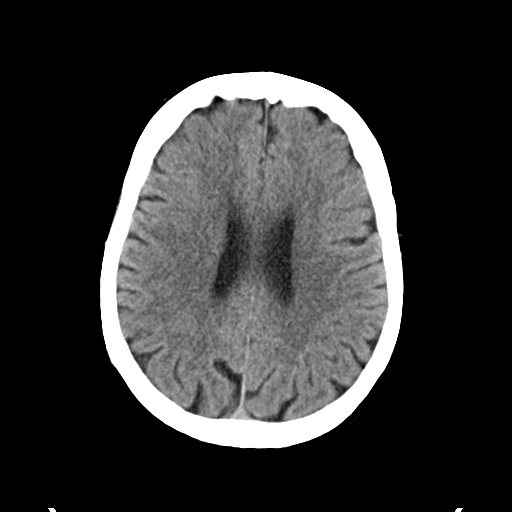
[im 21/33  brain]
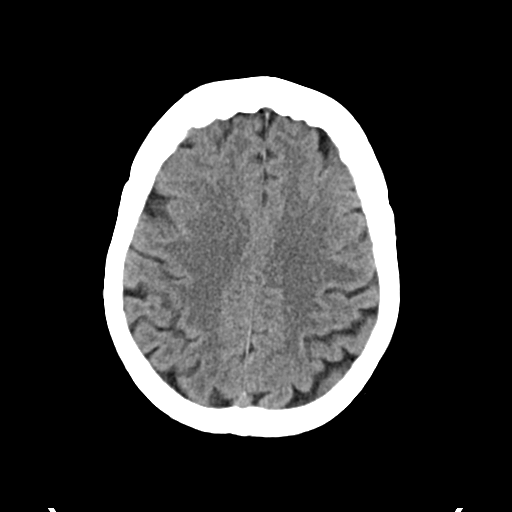
[im 24/33  brain]
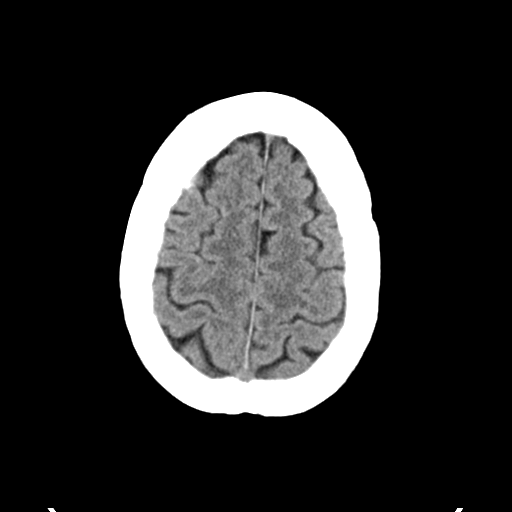
[im 25/33  brain]
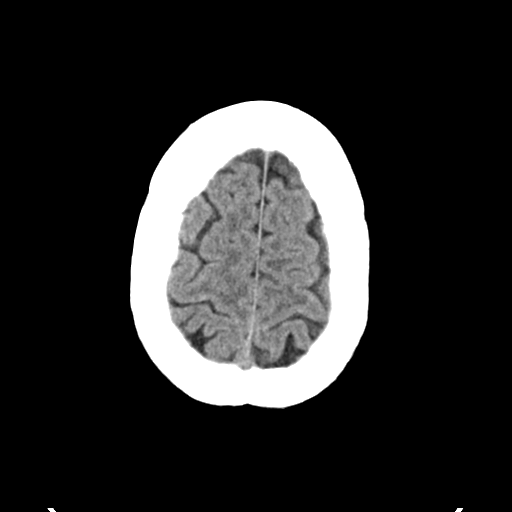
[im 25/33  bone]
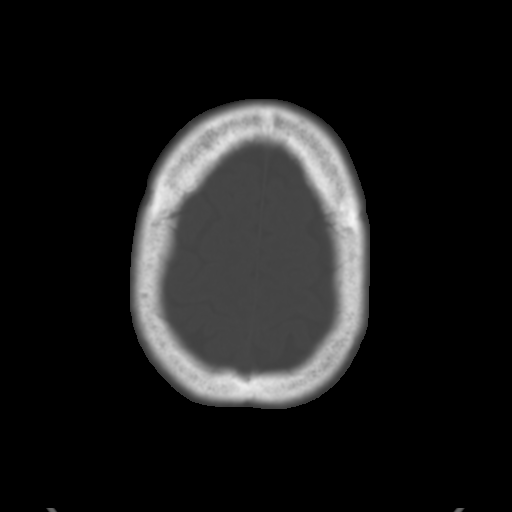
[im 27/33  brain]
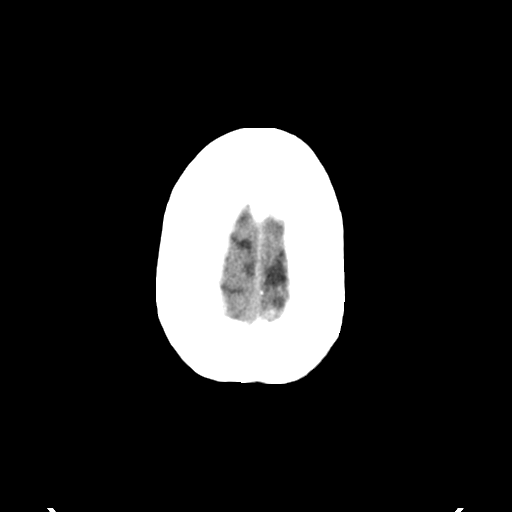
[im 29/33  brain]
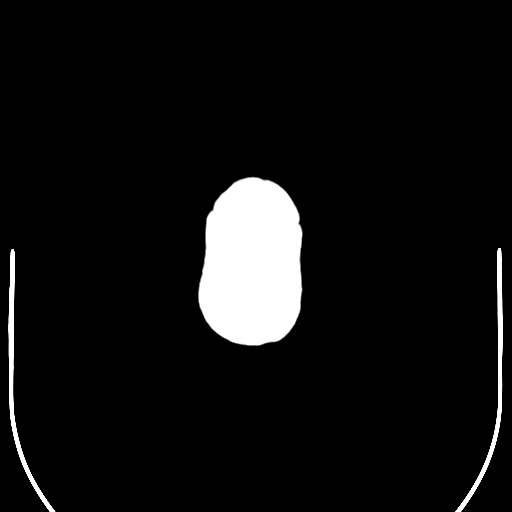
[im 31/33  brain]
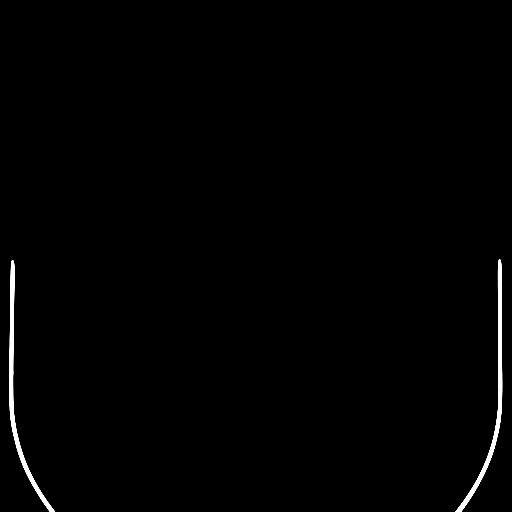

[16 of 30 positions shown; findings below may reference images not displayed]

FINDINGS: Brain: No evidence of acute infarction, hemorrhage, hydrocephalus,
extra-axial collection or mass lesion/mass effect. Small lacunar
infarct the left caudate nucleus, as before.

Vascular: Atherosclerotic and physiologic intracranial
calcifications.

Skull: Normal. Negative for fracture or focal lesion.

Sinuses/Orbits: Retention cyst or polyp in the right maxillary
sinus. No acute findings.

Other: None.
IMPRESSION: 1. Negative for bleed or other acute intracranial process

## 2018-03-16 ENCOUNTER — Telehealth: Payer: Self-pay | Admitting: Neurology

## 2018-03-16 NOTE — Telephone Encounter (Signed)
Patient wants to talk to [facility] and states that she can call her back tomorrow she left message earlier today

## 2018-03-16 NOTE — Telephone Encounter (Signed)
Patient left VM and states that she had a really bad painful night the other night. She states it was like RLS with Cramps

## 2018-03-17 NOTE — Telephone Encounter (Signed)
Spoke with patient. She states having episodes of cramping in legs. Describes it has "charlie horses" that move throughout her legs. These episodes seem to be happening nightly between 8-10 pm and are the worst between 2-4 am. States still having feeling of tightness in chest with SOB with these episodes. She did see heart doctor at [facility] (notes in Care Everywhere).   Please advise.

## 2018-03-17 NOTE — Telephone Encounter (Signed)
She has an appt with me in few weeks and probably best to discuss then.  I don't take care of CP/SOB and not associated with PD so needs to call cardiology.    Confirm medication: Off of pramipexole? On carbidopa/levodopa 25/100 2 pills at 6 AM/9 AM/noon/3 PM and 1 at 5 PM if she needs that? On carbidopa/levodopa 50/200 at night?

## 2018-03-17 NOTE — Telephone Encounter (Signed)
Patient made aware. Will send order to [facility] at [phone].

## 2018-03-17 NOTE — Telephone Encounter (Signed)
She stated it was at bedtime which is 8 pm. Sometimes PRN dose is around 6-7 pm but sometimes patient doesn't want it and if patient can not sleep she asks for IR PRN dose so it is given later.

## 2018-03-17 NOTE — Telephone Encounter (Signed)
If has a night where has the cramping, then can awaken and take 1/2 tablet of the IR and chew it (use the prn dose for this)

## 2018-03-17 NOTE — Telephone Encounter (Signed)
I did make patient aware, again, during our earlier conversation that SOB is not from PD.   I called and spoke with Misty Stanley, RN supervisor at [facility] [phone]) and confirmed patient is taking medications as follows:   She is off Pramipexole. She is taking Carbidopa Levodopa 25/100 IR - 2 tablets at 6am, 9am, 12pm, 3pm. She takes 1 PRN anytime between 6pm-10pm.  She is taking Carbidopa Levodopa 50/200 CR - 1 at 8 pm.  She is also on Baclofen 10 mg - 1 TID as needed, but has only been using this one time daily.  Also taking Xanax PRN. She is taking this about once a day.   Please advise if any changes or just discuss at follow up appt.

## 2018-03-17 NOTE — Telephone Encounter (Signed)
Why is the CR not at bedtime, but at 8pm and then takes carbidopa/levodopa prn between 6-10pm?  She needs to be taking the carbidopa/levodopa CR at bed

## 2018-03-17 NOTE — Telephone Encounter (Signed)
Documented in previous open phone note.

## 2018-03-18 NOTE — Telephone Encounter (Signed)
Order faxed to [facility] with confirmation received.

## 2018-03-20 NOTE — Telephone Encounter (Signed)
Lisa at [facility] called to state they can not have two orders and asked to switch to Carbidopa Levodopa 25/100 IR - 1/2 BID. Verbal order given. [phone].

## 2018-03-29 ENCOUNTER — Encounter (HOSPITAL_COMMUNITY): Payer: Self-pay

## 2018-03-29 ENCOUNTER — Emergency Department (HOSPITAL_COMMUNITY)
Admission: EM | Admit: 2018-03-29 | Discharge: 2018-03-30 | Disposition: A | Discharge status: Home / Self Care | Payer: Medicare Other | Attending: Emergency Medicine | Admitting: Emergency Medicine

## 2018-03-29 ENCOUNTER — Emergency Department (HOSPITAL_COMMUNITY): Payer: Medicare Other

## 2018-03-29 DIAGNOSIS — G20 Parkinson's disease: Secondary | ICD-10-CM | POA: Insufficient documentation

## 2018-03-29 DIAGNOSIS — R252 Cramp and spasm: Secondary | ICD-10-CM

## 2018-03-29 DIAGNOSIS — Z79899 Other long term (current) drug therapy: Secondary | ICD-10-CM | POA: Diagnosis not present

## 2018-03-29 DIAGNOSIS — R079 Chest pain, unspecified: Secondary | ICD-10-CM

## 2018-03-29 DIAGNOSIS — R0789 Other chest pain: Secondary | ICD-10-CM | POA: Insufficient documentation

## 2018-03-29 LAB — CBC WITH DIFFERENTIAL/PLATELET
Basophils Absolute: 0 10*3/uL (ref 0.0–0.1)
Basophils Relative: 1 %
EOS ABS: 0 10*3/uL (ref 0.0–0.7)
EOS PCT: 1 %
HCT: 30.4 % — ABNORMAL LOW (ref 36.0–46.0)
Hemoglobin: 10.1 g/dL — ABNORMAL LOW (ref 12.0–15.0)
LYMPHS ABS: 1.2 10*3/uL (ref 0.7–4.0)
LYMPHS PCT: 33 %
MCH: 34 pg (ref 26.0–34.0)
MCHC: 33.2 g/dL (ref 30.0–36.0)
MCV: 102.4 fL — AB (ref 78.0–100.0)
Monocytes Absolute: 0.4 10*3/uL (ref 0.1–1.0)
Monocytes Relative: 10 %
Neutro Abs: 2 10*3/uL (ref 1.7–7.7)
Neutrophils Relative %: 55 %
PLATELETS: 217 10*3/uL (ref 150–400)
RBC: 2.97 MIL/uL — ABNORMAL LOW (ref 3.87–5.11)
RDW: 14.5 % (ref 11.5–15.5)
WBC: 3.7 10*3/uL — ABNORMAL LOW (ref 4.0–10.5)

## 2018-03-29 LAB — COMPREHENSIVE METABOLIC PANEL
ALT: 5 U/L — AB (ref 14–54)
ANION GAP: 10 (ref 5–15)
AST: 16 U/L (ref 15–41)
Albumin: 3.7 g/dL (ref 3.5–5.0)
Alkaline Phosphatase: 75 U/L (ref 38–126)
BUN: 20 mg/dL (ref 6–20)
CHLORIDE: 106 mmol/L (ref 101–111)
CO2: 25 mmol/L (ref 22–32)
CREATININE: 0.65 mg/dL (ref 0.44–1.00)
Calcium: 8.8 mg/dL — ABNORMAL LOW (ref 8.9–10.3)
Glucose, Bld: 102 mg/dL — ABNORMAL HIGH (ref 65–99)
POTASSIUM: 4.1 mmol/L (ref 3.5–5.1)
Sodium: 141 mmol/L (ref 135–145)
TOTAL PROTEIN: 6.6 g/dL (ref 6.5–8.1)
Total Bilirubin: 0.3 mg/dL (ref 0.3–1.2)

## 2018-03-29 LAB — I-STAT TROPONIN, ED: TROPONIN I, POC: 0.01 ng/mL (ref 0.00–0.08)

## 2018-03-29 LAB — D-DIMER, QUANTITATIVE: D-Dimer, Quant: <0.27 ug/mL-FEU (ref 0.00–0.50)

## 2018-03-29 MED ORDER — SODIUM CHLORIDE 0.9 % IV BOLUS
500.0000 mL | Freq: Once | INTRAVENOUS | Status: AC
  Administered 2018-03-29: 500 mL via INTRAVENOUS

## 2018-03-29 NOTE — ED Provider Notes (Signed)
[facility]-EMERGENCY DEPT Provider Note  CSN: [id number] Arrival date & time: 03/29/18 2105  Chief Complaint(s) No chief complaint on file.  HPI Nichole Krueger is a 82 y.o. female   The history is provided by the patient.  Chest Pain   This is a recurrent problem. The current episode started 3 to 5 hours ago. The problem occurs rarely. The problem has been resolved. The pain is associated with coughing. Pain location: sternal. The pain is moderate. The quality of the pain is described as brief, pressure-like and sharp. The pain does not radiate. Episode Length: <44min. Associated symptoms include cough (just prior to feeling the pain ), leg pain (intermittent BLE cramping pain that has been ongoing for several weeks) and lower extremity edema. Pertinent negatives include no abdominal pain, no diaphoresis, no fever, no headaches, no hemoptysis and no nausea. She has tried nothing for the symptoms. Risk factors include being elderly.  Her past medical history is significant for anxiety/panic attacks, arrhythmia and PE.   Patient also endorsing intermittent spasming of her entire body lasting several minutes and self resolving.  She denies any associated focal weakness, associated headache, altered mental status or loss of consciousness with these episodes.  No urinary or bladder incontinence.  Past Medical History Past Medical History:  Diagnosis Date  . Anxiety   . Atrial fibrillation (HCC)   . Macular degeneration   . Parkinson's disease (HCC)   . Pulmonary embolism [facility])    Patient Active Problem List   Diagnosis Date Noted  . Impaired fasting glucose 10/15/2017  . Normocytic anemia 10/15/2017  . Pulmonary arterial hypertension (HCC) 10/15/2017  . Chest pain 10/14/2017  . Orthostatic syncope 05/06/2017  . Syncope 05/04/2017  . Muscle rigidity 01/31/2017  . AF (paroxysmal atrial fibrillation) (HCC) 01/31/2017  . Parkinson's disease (HCC) 01/31/2017   Home  Medication(s) Prior to Admission medications   Medication Sig Start Date End Date Taking? Authorizing Provider  acetaminophen (TYLENOL) 500 MG tablet Take 1,000 mg by mouth every 6 (six) hours as needed for headache.   Yes [provider]  ALPRAZolam (XANAX) 0.25 MG tablet Take 1 tablet (0.25 mg total) by mouth daily as needed for anxiety. 10/15/17  Yes Sheikh, Omair Latif, DO  apixaban (ELIQUIS) 5 MG TABS tablet Take 5 mg by mouth 2 (two) times daily.   Yes [provider]  baclofen (LIORESAL) 10 MG tablet Take 1 tablet (10 mg total) by mouth 3 (three) times daily as needed for muscle spasms (leg muscle spasms). Patient taking differently: Take 10 mg by mouth 3 (three) times daily.  02/01/17  Yes Rolly Salter, MD  carbidopa-levodopa (SINEMET CR) 50-200 MG tablet Take 1 tablet by mouth at bedtime. 08/25/17  Yes Tat, Octaviano Batty, DO  carbidopa-levodopa (SINEMET IR) 25-100 MG tablet 2 tablets at 6 am, 2 tablets at 9 am, 2 tablets at 12 pm, 2 tablets at 3 pm, 1 PRN at 5 pm 08/26/17  Yes Tat, Lurena Joiner S, DO  cyanocobalamin (,VITAMIN B-12,) 1000 MCG/ML injection Inject 1 mL (1,000 mcg total) into the muscle every 30 (thirty) days. 07/24/17  Yes Tat, Octaviano Batty, DO  magnesium hydroxide (MILK OF MAGNESIA) 400 MG/5ML suspension Take 5 mLs by mouth as needed for mild constipation.   Yes [provider]  Multiple Vitamin (MULTI-VITAMINS) TABS Take 1 tablet by mouth daily. 05/14/11  Yes [provider]  selegiline (ELDEPRYL) 5 MG capsule TAKE 1 TABLET (  TOTALLY) BY MOUTH TWICE DAILY AT 9  AM AND 9PM 01/03/17  Yes [provider]  pramipexole (MIRAPEX) 0.25 MG tablet TAKES 0.25 MG BY MOUTH THREE TIMES DAILY Patient not taking: Reported on 03/29/2018 08/25/17   Tat, Octaviano Batty, DO                                                                                                                                    Past Surgical History Past Surgical History:  Procedure  Laterality Date  . BACK SURGERY  2017   x3  . CATARACT EXTRACTION Right   . EXTERNAL FIXATION ARM     Family History Family History  Problem Relation Age of Onset  . Stroke Mother   . Heart attack Father   . Heart attack Sister     Social History Social History   Tobacco Use  . Smoking status: Never Smoker  . Smokeless tobacco: Never Used  Substance Use Topics  . Alcohol use: Yes    Comment: occasional wine  . Drug use: No   Allergies Other and Thorazine [chlorpromazine]  Review of Systems Review of Systems  Constitutional: Negative for diaphoresis and fever.  Respiratory: Positive for cough (just prior to feeling the pain ). Negative for hemoptysis.   Cardiovascular: Positive for chest pain.  Gastrointestinal: Negative for abdominal pain and nausea.  Neurological: Negative for headaches.   All other systems are reviewed and are negative for acute change except as noted in the HPI  Physical Exam Vital Signs  I have reviewed the triage vital signs BP (!) 189/97   Pulse 85   Temp 97.9 F (36.6 C) (Oral)   Resp 20   SpO2 100%   Physical Exam  Constitutional: She is oriented to person, place, and time. She appears well-developed and well-nourished. No distress.  HENT:  Head: Normocephalic and atraumatic.  Nose: Nose normal.  Eyes: Pupils are equal, round, and reactive to light. Conjunctivae and EOM are normal. Right eye exhibits no discharge. Left eye exhibits no discharge. No scleral icterus.  Neck: Normal range of motion. Neck supple.  Cardiovascular: Normal rate and regular rhythm. Exam reveals no gallop and no friction rub.  No murmur heard. Pulmonary/Chest: Effort normal and breath sounds normal. No stridor. No respiratory distress. She has no rales. She exhibits tenderness.    Abdominal: Soft. She exhibits no distension. There is no tenderness.  Musculoskeletal: She exhibits no edema or tenderness.  Neurological: She is alert and oriented to person,  place, and time. She has normal strength. She displays tremor (resting).  Skin: Skin is warm and dry. No rash noted. She is not diaphoretic. No erythema.  Psychiatric: She has a normal mood and affect.  Vitals reviewed.   ED Results and Treatments Labs (all labs ordered are listed, but only abnormal results are displayed) Labs Reviewed  CBC WITH DIFFERENTIAL/PLATELET - Abnormal; Notable for the following components:      Result Value   WBC  3.7 (*)    RBC 2.97 (*)    Hemoglobin 10.1 (*)    HCT 30.4 (*)    MCV 102.4 (*)    All other components within normal limits  COMPREHENSIVE METABOLIC PANEL - Abnormal; Notable for the following components:   Glucose, Bld 102 (*)    Calcium 8.8 (*)    ALT 5 (*)    All other components within normal limits  D-DIMER, QUANTITATIVE (NOT AT [facility])  I-STAT TROPONIN, ED                                                                                                                         EKG  EKG Interpretation  Date/Time:  Sunday Mar 29 2018 21:59:48 EDT Ventricular Rate:  74 PR Interval:    QRS Duration: 76 QT Interval:  371 QTC Calculation: 412 R Axis:   29 Text Interpretation:  Accelerated junctional rhythm Probable anterior infarct, old No significant change since last tracing Confirmed by Drema Pry [phone]) on 03/30/2018 12:28:23 AM      Radiology Dg Chest 2 View  Result Date: 03/29/2018 CLINICAL DATA:  Chronic chest heaviness. EXAM: CHEST - 2 VIEW COMPARISON:  Chest radiograph performed 10/31/2017 FINDINGS: The lungs are well-aerated and clear. There is no evidence of focal opacification, pleural effusion or pneumothorax. The heart is normal in size; the mediastinal contour is within normal limits. No acute osseous abnormalities are seen. Thoracolumbar spinal fusion rods are noted, with underlying chronic compression deformities and changes of vertebroplasty. IMPRESSION: No acute cardiopulmonary process seen. Electronically Signed   By:  Roanna Raider M.D.   On: 03/29/2018 23:05   Pertinent labs & imaging results that were available during my care of the patient were reviewed by me and considered in my medical decision making (see chart for details).  Medications Ordered in ED Medications  sodium chloride 0.9 % bolus 500 mL (0 mLs Intravenous Stopped 03/30/18 0157)                                                                                                                                    Procedures Procedures  (including critical care time)  Medical Decision Making / ED Course I have reviewed the nursing notes for this encounter and the patient's prior records (if available in EHR or on provided paperwork).    Chest pain most suspicious for muscle spasm following coughing spell.  It is highly inconsistent with ACS.  EKG without acute ischemic changes of pericarditis.  Troponin drawn approximately 6 hours after pain resolved was negative.   to feel that additional cardiac work-up is necessary at this time.  I have a low suspicion for pulmonary embolism given patient's history of prior PEs, d-dimer was obtained which was negative.  Presentation is not classic for aortic dissection or esophageal perforation. Chest x-ray without evidence suggestive of pneumonia, pneumothorax, pneumomediastinum.  No abnormal contour of the mediastinum to suggest dissection. No evidence of acute injuries.  The patient appears reasonably screened and/or stabilized for discharge and I doubt any other medical condition or other [facility] requiring further screening, evaluation, or treatment in the ED at this time prior to discharge.  The patient is safe for discharge with strict return precautions.   Final Clinical Impression(s) / ED Diagnoses Final diagnoses:  Chest pain  Atypical chest pain  Muscle cramps    Disposition: Discharge  Condition: Good  I have discussed the results, Dx and Tx plan with the patient who expressed understanding  and agree(s) with the plan. Discharge instructions discussed at great length. The patient was given strict return precautions who verbalized understanding of the instructions. No further questions at time of discharge.    ED Discharge Orders    None       Follow Up: Neuromuscular Neurologist  Schedule an appointment as soon as possible for a visit    Stoneking, Hal, MD [address] [place] [address] [phone]  Call       This chart was dictated using voice recognition software.  Despite best efforts to proofread,  errors can occur which can change the documentation meaning.   Nira Conn, MD 03/30/18 [phone]

## 2018-03-29 NOTE — ED Triage Notes (Addendum)
Patient BIB EMS from [facility] for "heaviness on chest" for 6 months. Pt had an episode of chest pain that resolved when facility gave her her zanax. Pt has severe parkinsons with muscle spasms and has been worried about end of life.  Vitals BP 158/88 RR 18 HR 86

## 2018-03-29 NOTE — ED Notes (Signed)
Bed: WA17 Expected date:  Expected time:  Means of arrival:  Comments: Ems seizure 

## 2018-03-30 ENCOUNTER — Telehealth: Payer: Self-pay | Admitting: Neurology

## 2018-03-30 MED ORDER — CARBIDOPA-LEVODOPA ER 50-200 MG PO TBCR
1.0000 | EXTENDED_RELEASE_TABLET | Freq: Every day | ORAL | Status: DC
  Administered 2018-03-30: 1 via ORAL
  Filled 2018-03-30: qty 1

## 2018-03-30 NOTE — ED Notes (Signed)
Patient has had to urinate multiple times and required lots of assistance in getting to the bathroom and having briefs and clothing changed etc.. Patient began to have intense muscle spasms prior to being discharged so this nurse asked an EDP for her nightly dose of Cabidopa-Levodopa due to it being overdue per her scheduled times.

## 2018-03-30 NOTE — Telephone Encounter (Signed)
Patient called and wanted to know if Dr. Arbutus Leas had received her medical History from [facility]? Please Advise. Thanks

## 2018-03-30 NOTE — Telephone Encounter (Signed)
Patient was made aware during 03/17/18 conversation that we have records from [facility].

## 2018-04-01 NOTE — Progress Notes (Signed)
Nichole Krueger was seen today in the movement disorders clinic for neurologic consultation at the request of Nichole Laughter, MD.  The consultation is for the evaluation of PD.  The records that were made available to me were reviewed.  Pt currently under the care of Dr. Rubin Krueger.  Last seen by [facility] in May, 2018.  Patient was diagnosed with Parkinson's disease in 1998.  Her first symptom was R hand tremor.  She cannot remember what her first medication was.  She states that she really did well until about 5 years ago  When she was last seen at [facility], her medication was slightly changed.  She was told to take her carbidopa/levodopa 25/100, 2 pills at 6 AM/9 AM/noon/3 PM and one pill at 6 PM and 9 PM.  This was a one pill increase but it doesn't appear that this was done and she is still on carbidopa/levodopa 25/100, 2 pills at 6 AM/9 AM/noon/3 PM and one pill at 5 PM prn.  She is also on carbidopa/levodopa 50/200 at bedtime.  She was told to take pramipexole 0.25 mg 3 times per day and then 2 tablets at bedtime; again, this does not appear to have been transcribed to the nursing facility and she is now on pramipexole 0.25 mg 3 times per day and 1 mg at bedtime.    She was to maintain selegiline 5 mg, one tablet twice per day and Aricept 5 mg nightly.  She is no longer on aricept when she returns today.  She is not completely happy with current regimen and thinks that has more dyskinesia after 9am/3pm dosage.     Specific Symptoms:  Tremor: Yes.  , occasionally (and now it can be in both hands but doesn't occur regulary) Family hx of similar:  No. (grandmother had some tremor) Voice: softer especially when tired Sleep: some trouble with sleep (may fall asleep in front of TV)  Vivid Dreams:  No.  Acting out dreams:  No. Wet Pillows: Yes.   Postural symptoms:  Yes.    Falls?  Yes.  , last one 2-3 months ago (was more of a passing out than a fall.  Doesn't recall much about event).  Has  not had other falls Bradykinesia symptoms: difficulty getting out of a chair and difficulty regaining balance Loss of smell:  Yes.   Loss of taste:  No. Urinary Incontinence:  No. Difficulty Swallowing:  Yes.   (some trouble with pills but does better with taking with bananas/applesauce) Handwriting, micrographia: Yes.   (when she wrote here it didn't reflect this but reports that it often is little) Trouble with ADL's:  Yes.   (cannot tie shoes but otherwise does dress self)  Trouble buttoning clothing: Yes.   Depression:  No. Memory changes:  Yes.  , trouble with names Hallucinations:  Yes.  , in the past she had vivid hallucination and called police.  This was few years ago and hasn't had since (this was in 2015)  visual distortions: Yes.   N/V:  No. Lightheaded:  Yes.   (occasionally)  Syncope: Yes.   (in early June she had passing out episode and fell and hit back.  Now seeing spine for LBP) Diplopia:  No. Dyskinesia:  Yes.   (has dyskinesia during "on" periods and dystonia in "off" periods).  Pt reports that dystonia manifests as inversion of ankles bilaterally and then the "cramping" will progress up the body.  She states that she uses baclofen now  for that 10 mg tid.  Neuroimaging of the brain has  previously been performed.  It is available for my review today.  CT brain done in 05/16/17 and it was unremarkable for acute process.  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  I just slightly decreased her dosages of levodopa last visit because of dyskinesia which was bothersome to her.  She ultimately did not like that change and increased her medication back to carbidopa/levodopa 25/100, to 2 pills at 6 AM/9 AM/noon/3 PM and 1 at 5 PM if she needs that.  She is also on pramipexole, 0.25 mg 3 times per day and then takes 1 mg at night.  She has been to the emergency room numerous times since last visit.  She went in early November with a discomfort and  stiffness in her abdomen.  She felt that she was having shortness of breath.  The workup in the emergency room was negative.  She went to the emergency room at the end of November with right-sided facial droop.  This apparently resolved while EMS was present and had resolved by the time she got to the hospital.  MRI of the brain was attempted, but the patient could not tolerate it.  The neuro hospitalist saw the patient and felt his suspicion for stroke was low.  He felt that her symptoms could be due to missing her levodopa earlier that day.  She called me 2 days after hospital discharge to state that she was having blackout spells.  She reported that prior to hospital admission, she was in her living room and the next thing that she knew is that people were around her trying to get her open her eyes.  She thinks that she passed out before she had the facial droop that led to the hospitalization.  Ambulatory EEG was scheduled with my office.  She was agreeable, but then canceled it.  She went back to the hospital on October 31, 2017, after being found by a security guard at her facility.  There are no other records surrounding this.  The patient was examined by the emergency room, felt that she had missed several doses of levodopa and discharge from the emergency room was recommended.  Just before discharge, she complained that she was having difficulty swallowing.  A bedside swallowing evaluation was completed, which she did not pass and a dysphagia 3 (mechanical soft) diet was recommended.  Interestingly, the patient also complained of left-sided facial tightness during this ER stay.  She was ultimately discharged back to [facility], but to the skilled nursing facility portion instead of independent living.  She states that she is back in independent living.  She is still doing therapy.  She thinks that the therapy helps.  She is having hallucinations and that "is a real problem."  She feels that she is seeing  her son which is now deceased.  She does know it is not real.  She, however, feels that the "hallucinations are drawing me into their world."  She thinks that moods are good but admits to anxiety.   04/03/18 update: Patient is seen today in follow-up.  She is accompanied by a caregiver from [facility] who supplements the history.  I weaned her off of pramipexole since our last visit, primarily because of hallucinations.  She is still on carbidopa/levodopa 25/100, 2 pills at 6 AM/9 AM/noon/3 PM and an additional at 5 PM if needed.  She instead of taking the  1 tablet of 5pm, she takes 1/2 tablet at 6pm and 1/2 at 2:30am.  She is also on carbidopa/levodopa 50/200 at bed.   I have received numerous phone calls from her and [facility] since our last visit.  Records have been reviewed.  She was in the emergency room on December 06, 2017 after a fall.  CT was nonacute and she was discharged from the emergency room.  I got a call in February from [facility] about episodes of tightness in the patient's diaphragm associated with shortness of breath (and have subsequently gotten several calls about this).  She had previously discussed with me similar episodes, as documented in other notes.  I did not think that these are related to Parkinson's disease, and had previously offered ambulatory EEG but she canceled it.  She did end up having that done on February 28 and it was normal.  This was done after [facility] had called and said that she has had episodes of mental status change, even with sternal rub.  I did recommend cardiology follow-up.  She was previously seen for paroxysmal atrial fibrillation by cardiology.  Told her that I saw no reason for her to have these from a neurologic standpoint, if vitals were good, which they were per the facility.  Patient was back in the emergency room on Mar 29, 2018 for spasms in her entire body.  She has been there previously for that.  Work-up was negative, including cardiac work-up.   She does have freezing of gait but only 2-3 times total since last visit.  Some occasional trouble with "morning on."  PREVIOUS MEDICATIONS: Sinemet and MirapexRytary (hallucinations); amantadine (hallucinations); stalevo (too expensive);   ALLERGIES:   Allergies  Allergen Reactions  . Other Other (See Comments)    ANESTHESIA-HALLUCINATIONS  . Thorazine [Chlorpromazine] Other (See Comments)    Unknown    CURRENT MEDICATIONS:  Outpatient Encounter Medications as of 04/03/2018  Medication Sig  . acetaminophen (TYLENOL) 500 MG tablet Take 1,000 mg by mouth every 6 (six) hours as needed for headache.  . ALPRAZolam (XANAX) 0.25 MG tablet Take 1 tablet (0.25 mg total) by mouth daily as needed for anxiety.  Marland Kitchen apixaban (ELIQUIS) 5 MG TABS tablet Take 5 mg by mouth 2 (two) times daily.  . baclofen (LIORESAL) 10 MG tablet Take 1 tablet (10 mg total) by mouth 3 (three) times daily as needed for muscle spasms (leg muscle spasms). (Patient taking differently: Take 10 mg by mouth 3 (three) times daily. )  . carbidopa-levodopa (SINEMET CR) 50-200 MG tablet Take 1 tablet by mouth at bedtime.  . carbidopa-levodopa (SINEMET IR) 25-100 MG tablet 2 tablets at 6 am, 2 tablets at 9 am, 2 tablets at 12 pm, 2 tablets at 3 pm, 1 PRN at 5 pm  . cyanocobalamin (,VITAMIN B-12,) 1000 MCG/ML injection Inject 1 mL (1,000 mcg total) into the muscle every 30 (thirty) days.  . magnesium hydroxide (MILK OF MAGNESIA) 400 MG/5ML suspension Take 5 mLs by mouth as needed for mild constipation.  . Multiple Vitamin (MULTI-VITAMINS) TABS Take 1 tablet by mouth daily.  . pramipexole (MIRAPEX) 0.25 MG tablet TAKES 0.25 MG BY MOUTH THREE TIMES DAILY (Patient not taking: Reported on 03/29/2018)  . selegiline (ELDEPRYL) 5 MG capsule TAKE 1 TABLET (  TOTALLY) BY MOUTH TWICE DAILY AT 9 AM AND 9PM   No facility-administered encounter medications on file as of 04/03/2018.     PAST MEDICAL HISTORY:   Past Medical History:  Diagnosis  Date  .  Anxiety   . Atrial fibrillation (HCC)   . Macular degeneration   . Parkinson's disease (HCC)   . Pulmonary embolism (HCC)     PAST SURGICAL HISTORY:   Past Surgical History:  Procedure Laterality Date  . BACK SURGERY  2017   x3  . CATARACT EXTRACTION Right   . EXTERNAL FIXATION ARM      SOCIAL HISTORY:   Social History   Socioeconomic History  . Marital status: Widowed    Spouse name: Not on file  . Number of children: Not on file  . Years of education: Not on file  . Highest education level: Not on file  Occupational History  . Not on file  Social Needs  . Financial resource strain: Not on file  . Food insecurity:    Worry: Not on file    Inability: Not on file  . Transportation needs:    Medical: Not on file    Non-medical: Not on file  Tobacco Use  . Smoking status: Never Smoker  . Smokeless tobacco: Never Used  Substance and Sexual Activity  . Alcohol use: Yes    Comment: occasional wine  . Drug use: No  . Sexual activity: Not on file  Lifestyle  . Physical activity:    Days per week: Not on file    Minutes per session: Not on file  . Stress: Not on file  Relationships  . Social connections:    Talks on phone: Not on file    Gets together: Not on file    Attends religious service: Not on file    Active member of club or organization: Not on file    Attends meetings of clubs or organizations: Not on file    Relationship status: Not on file  . Intimate partner violence:    Fear of current or ex partner: Not on file    Emotionally abused: Not on file    Physically abused: Not on file    Forced sexual activity: Not on file  Other Topics Concern  . Not on file  Social History Narrative  . Not on file    FAMILY HISTORY:   Family Status  Relation Name Status  . Mother  Deceased  . Father  Deceased  . Sister  (Not Specified)  . Son x2 Alive    ROS:  A complete 10 system review of systems was obtained and was unremarkable apart from what  is mentioned above.  PHYSICAL EXAMINATION:    VITALS:   There were no vitals filed for this visit.  GEN:  The patient appears stated age and is in NAD. HEENT:  Normocephalic, atraumatic.  The mucous membranes are moist. The superficial temporal arteries are without ropiness or tenderness. CV:  RRR Lungs:  CTAB Neck/HEME:  There are no carotid bruits bilaterally.  Neurological examination:  Orientation:  Montreal Cognitive Assessment  07/24/2017  Visuospatial/ Executive (0/5) 5  Naming (0/3) 2  Attention: Read list of digits (0/2) 2  Attention: Read list of letters (0/1) 1  Attention: Serial 7 subtraction starting at 100 (0/3) 0  Language: Repeat phrase (0/2) 2  Language : Fluency (0/1) 1  Abstraction (0/2) 2  Delayed Recall (0/5) 2  Orientation (0/6) 6  Total 23  Adjusted Score (based on education) 23   Cranial nerves: There is good facial symmetry. Pupils are equal round and reactive to light bilaterally. Fundoscopic exam reveals clear margins bilaterally. Extraocular muscles are intact. The visual fields are full to confrontational  testing. The speech is fluent and clear. Soft palate rises symmetrically and there is no tongue deviation. Hearing is intact to conversational tone. Sensation: Sensation is intact to light touch x 4 Motor: Strength is at least antigravity x 4   Movement examination: Tone: There is normal tone in the bilateral upper extremities.  The tone in the lower extremities is normal.  Abnormal movements: There is moderate dyskinesia noted, esp in the LLE Coordination:  There is good RAMs with any form of RAMS, including alternating supination and pronation of the forearm, hand opening and closing (mild trouble with this only due to arthritis), finger taps, heel taps and toe taps.. Gait and Station: did not attempt today.  In [facility]  Labs:  Her B12 on 04/02/2017 at [facility] was 290    ASSESSMENT/PLAN:  1.  Idiopathic Parkinson's disease.  The patient was dx  in 1998.    -while patient has done incredibly well, she is bothered by on dyskinesia and off dystonia.  Amantadine has caused hallucinations in the past.  Marilynne Drivers has talked to her extensively about DBS therapy as well as duopa.  I've addressed as well She doesn't feel that she could handle mentally DBS and doesn't think that she could physically handle the duopa tube.  She has been previously given literature on both of these topics.  We talked about the pump patch system in trial.    Discussed inbrija for freezing and first morning on.  Demonstrated how to use that today.  She would like to try it.  Risks, benefits, side effects and alternative therapies were discussed.  The opportunity to ask questions was given and they were answered to the best of my ability.  The patient expressed understanding and willingness to follow the outlined treatment protocols.  -She will continue carbidopa/levodopa 25/100, 2 tablets at 6 AM/9 AM/noon/3 PM and then she takes 1/2 tablet at 6 PM and another half tablet in the middle of the night.  -She will continue carbidopa/levodopa 50/200 at bedtime.   -We discussed that it used to be thought that levodopa would increase risk of melanoma but now it is believed that Parkinsons itself likely increases risk of melanoma. she is to get regular skin checks.  -We discussed community resources in the area including patient support groups and community exercise programs for PD and pt education was provided to the patient.  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  She has good support at [facility] and is engaged there and receiving therapy there.  2.  B12 deficiency  -Injections were recommended at [facility].  She is on B12 injections now and just had one this week.   3.  Facial droop  -The records that were made available to me were reviewed and thus far, work up for stroke negative and when she  has this she has missed her carbidopa/levodopa.  I wonder if this represents off dystonia.  The neurohospitalist reported low suspicion for stroke  4.  Abdominal pain/spasms and diffuse, full body muscle spasm.  -Explained to her that I do not think that these are from Parkinson's disease.  Explained that while Parkinson's disease can cause cramping (often times in the feet and leg at night), it is otherwise not a painful disease and does not cause diffuse full body muscle spasms.  I told her that she would need to explore this etiology/solution with a different physician.  She will  start with her primary care physician.  5.  I will see her back in the next 4 months, sooner should new neurologic issues arise.  Greater than 50% of the 25-minute visit was spent in counseling with the patient/caregiver.This did not include the 40 min of record review which was detailed above, which was non face to face time.   Cc:  Nichole Laughter, MD

## 2018-04-03 ENCOUNTER — Ambulatory Visit (INDEPENDENT_AMBULATORY_CARE_PROVIDER_SITE_OTHER): Payer: Medicare Other | Admitting: Neurology

## 2018-04-03 ENCOUNTER — Encounter: Payer: Self-pay | Admitting: Neurology

## 2018-04-03 VITALS — BP 120/64 | HR 76

## 2018-04-03 DIAGNOSIS — G20 Parkinson's disease: Secondary | ICD-10-CM

## 2018-04-03 DIAGNOSIS — M62838 Other muscle spasm: Secondary | ICD-10-CM | POA: Diagnosis not present

## 2018-04-03 DIAGNOSIS — E538 Deficiency of other specified B group vitamins: Secondary | ICD-10-CM | POA: Diagnosis not present

## 2018-04-03 MED ORDER — LEVODOPA 42 MG IN CAPS: 1.0000 | ORAL_CAPSULE | RESPIRATORY_TRACT | 0 refills | Status: AC | PRN

## 2018-04-03 NOTE — Patient Instructions (Addendum)
Inhale contents of Inbrija - two capsules up to five times daily as needed

## 2018-04-06 ENCOUNTER — Encounter: Payer: Self-pay | Admitting: Neurology

## 2018-04-06 ENCOUNTER — Telehealth: Payer: Self-pay | Admitting: Neurology

## 2018-04-06 NOTE — Telephone Encounter (Signed)
Tiffany called with a question regarding the medication Inbrija. The pharmacy does not have that medication. Is there an equivalent medication. Please Call. Thanks

## 2018-04-06 NOTE — Telephone Encounter (Signed)
This encounter was created in error - please disregard.

## 2018-04-06 NOTE — Telephone Encounter (Signed)
LMOM with Nichole Krueger letting them know Inbrija will come from a specialty pharmacy and be mailed to them. Not available locally and no alternatives. She is to call back with any other questions.

## 2018-04-09 ENCOUNTER — Telehealth: Payer: Self-pay | Admitting: Neurology

## 2018-04-09 NOTE — Telephone Encounter (Signed)
Received authorization for Inbrija on patient. Valid until 11/17/2018. Specialty pharmacy [facility]. Ph. [phone].

## 2018-04-21 ENCOUNTER — Telehealth: Payer: Self-pay | Admitting: Neurology

## 2018-04-21 NOTE — Telephone Encounter (Signed)
Anne with [facility] called [phone]) to let us know per facility policy patient can not have Inbrija inhaler on her without having a lock box. Patient is unable to open several lock boxes they have tried to supply her with to make this work- patient only has to demonstrate that she can open lock box once.   She can have medication, but will have to be kept on the nursing cart and she will have to call for it. Once asked for this could take anywhere from 2 minutes to 20 minutes.   I did ask what they do for patients with rescue breathing inhalers and they state if patients have them in their rooms they have to be kept in a lock box.   Dr. Arbutus Leasat [place] Childes- FYI.

## 2018-05-18 ENCOUNTER — Telehealth: Payer: Self-pay | Admitting: Neurology

## 2018-05-18 NOTE — Telephone Encounter (Signed)
Patient states had an issue Saturday where her memory is completely gone. She called police, but doesn't remember doing it. She was telling them the people at the nursing home were trying to keep her there against her will. She is blaming medication for this, but no other issues since starting medication. Inbrija is helping with sleeping, not helping with cramping.  She states she was also diagnosed late last week with pneumonia.  I let her know that illnesses will make her PD symptoms and often memory worse. I informed her the episode Saturday is probably related to illness and not medication. She will let herself recovery from illness and let us know if memory issues persist.  Dr. Arbutus Leasat - [place] ChildesFYI.

## 2018-05-18 NOTE — Telephone Encounter (Signed)
Agree.  Doesn't sound like TGA either.  Let us know if recurs.

## 2018-05-18 NOTE — Telephone Encounter (Signed)
Patient had a very bad reaction to the medication to the inhaler on  Saturday evening She does not remember but  She was very confused and called the police. she wants to talk to someone about this.

## 2018-05-27 ENCOUNTER — Telehealth: Payer: Self-pay | Admitting: Neurology

## 2018-05-27 DIAGNOSIS — G20 Parkinson's disease: Secondary | ICD-10-CM

## 2018-05-27 NOTE — Telephone Encounter (Signed)
RX for walker and letter mailed to patient.

## 2018-05-27 NOTE — Telephone Encounter (Signed)
[employer] called and said that Nichole Krueger is needing a new walker. She wanted to let Dr. Arbutus Leasat or Lesly RubensteinJade know that she will need to go through [facility] [address] Thanks

## 2018-06-24 ENCOUNTER — Telehealth: Payer: Self-pay | Admitting: Neurology

## 2018-06-24 NOTE — Telephone Encounter (Signed)
Patient lmom regarding her Ingrezza Inhaler medication. She had a question for you. Thanks

## 2018-06-24 NOTE — Telephone Encounter (Signed)
Tried to call patient back with no answer and no vm.

## 2018-08-06 ENCOUNTER — Ambulatory Visit: Payer: Medicare Other | Admitting: Neurology

## 2018-08-12 NOTE — Progress Notes (Addendum)
Nichole Krueger was seen today in [facility] for neurologic consultation at Nichole request of Nichole Laughter, MD.  Nichole consultation is for Nichole evaluation of PD.  Nichole records that were made available to me were reviewed.  Pt currently under Nichole care of Dr. Rubin Krueger.  Last seen by Nichole Krueger in May, 2018.  Patient was diagnosed with Parkinson's disease in 1998.  Her first symptom was R hand tremor.  She cannot remember what her first medication was.  She states that she really did well until about 5 years ago  When she was last seen at [facility], her medication was slightly changed.  She was told to take her carbidopa/levodopa 25/100, 2 pills at 6 AM/9 AM/noon/3 PM and one pill at 6 PM and 9 PM.  This was a one pill increase but it doesn't appear that this was done and she is still on carbidopa/levodopa 25/100, 2 pills at 6 AM/9 AM/noon/3 PM and one pill at 5 PM prn.  She is also on carbidopa/levodopa 50/200 at bedtime.  She was told to take pramipexole 0.25 mg 3 times per day and then 2 tablets at bedtime; again, this does not appear to have been transcribed to [facility] nursing facility and she is now on pramipexole 0.25 mg 3 times per day and 1 mg at bedtime.    She was to maintain selegiline 5 mg, one tablet twice per day and Aricept 5 mg nightly.  She is no longer on aricept when she returns today.  She is not completely happy with current regimen and thinks that has more dyskinesia after 9am/3pm dosage.     Specific Symptoms:  Tremor: Yes.  , occasionally (and now it can be in both hands but doesn't occur regulary) Family hx of similar:  No. (grandmother had some tremor) Voice: softer especially when tired Sleep: some trouble with sleep (may fall asleep in front of TV)  Vivid Dreams:  No.  Acting out dreams:  No. Wet Pillows: Yes.   Postural symptoms:  Yes.    Falls?  Yes.  , last one 2-3 months ago (was more of a passing out than a fall.  Doesn't recall much about event).  Has  not had other falls Bradykinesia symptoms: difficulty getting out of a chair and difficulty regaining balance Loss of smell:  Yes.   Loss of taste:  No. Urinary Incontinence:  No. Difficulty Swallowing:  Yes.   (some trouble with pills but does better with taking with bananas/applesauce) Handwriting, micrographia: Yes.   (when she wrote here it didn't reflect this but reports that it often is little) Trouble with ADL's:  Yes.   (cannot tie shoes but otherwise does dress self)  Trouble buttoning clothing: Yes.   Depression:  No. Memory changes:  Yes.  , trouble with names Hallucinations:  Yes.  , in Nichole past she had vivid hallucination and called police.  This was few years ago and hasn't had since (this was in 2015)  visual distortions: Yes.   N/V:  No. Lightheaded:  Yes.   (occasionally)  Syncope: Yes.   (in early June she had passing out episode and fell and hit back.  Now seeing spine for LBP) Diplopia:  No. Dyskinesia:  Yes.   (has dyskinesia during "on" periods and dystonia in "off" periods).  Pt reports that dystonia manifests as inversion of ankles bilaterally and then Nichole "cramping" will progress up Nichole body.  She states that she uses baclofen now  for that 10 mg tid.  Neuroimaging of Nichole brain has  previously been performed.  It is available for my review today.  CT brain done in 05/16/17 and it was unremarkable for acute process.  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  I just slightly decreased her dosages of levodopa last visit because of dyskinesia which was bothersome to her.  She ultimately did not like that change and increased her medication back to carbidopa/levodopa 25/100, to 2 pills at 6 AM/9 AM/noon/3 PM and 1 at 5 PM if she needs that.  She is also on pramipexole, 0.25 mg 3 times per day and then takes 1 mg at night.  She has been to [facility] emergency room numerous times since last visit.  She went in early November with a discomfort and  stiffness in her abdomen.  She felt that she was having shortness of breath.  [facility] workup in [facility] emergency room was negative.  She went to [facility] emergency room at [facility] end of November with right-sided facial droop.  This apparently resolved while EMS was present and had resolved by Nichole time she got to Nich[facility] hospital.  MRI of Nichole brain was attempted, but Nichole patient could not tolerate it.  Nichole neuro hospitalist saw Nichole patient and felt his suspicion for stroke was low.  He felt that her symptoms could be due to missing her levodopa earlier that day.  She called me 2 days after hospital discharge to state that she was having blackout spells.  She reported that prior to hospital admission, she was in her living room and Nichole next thing that she knew is that people were around her trying to get her open her eyes.  She thinks that she passed out before she had Nichole facial droop that led to Nichole hospitalization.  Ambulatory EEG was scheduled with my office.  She was agreeable, but then canceled it.  She went back to [facility] hospital on October 31, 2017, after being found by a security guard at her facility.  There are no other records surrounding this.  N[facility]ole patient was examined by Nich[facility] emergency room, felt that she had missed several doses of levodopa and discharge from [facility] emergency room was recommended.  Just before discharge, she complained that she was having difficulty swallowing.  A bedside swallowing evaluation was completed, which she did not pass and a dysphagia 3 (mechanical soft) diet was recommended.  Interestingly, Nichole patient also complained of left-sided facial tightness during this ER stay.  She was ultimately discharged back to [facility], but to [facility] skilled nursing facility portion instead of independent living.  She states that she is back in independent living.  She is still doing therapy.  She thinks that Nichole therapy helps.  She is having hallucinations and that "is a real problem."  She feels that she is seeing  her son which is now deceased.  She does know it is not real.  She, however, feels that Nichole "hallucinations are drawing me into their world."  She thinks that moods are good but admits to anxiety.   04/03/18 update: Patient is seen today in follow-up.  She is accompanied by a caregiver from [facility] who supplements Nichole history.  I weaned her off of pramipexole since our last visit, primarily because of hallucinations.  She is still on carbidopa/levodopa 25/100, 2 pills at 6 AM/9 AM/noon/3 PM and an additional at 5 PM if needed.  She instead of taking Nichole  1 tablet of 5pm, she takes 1/2 tablet at 6pm and 1/2 at 2:30am.  She is also on carbidopa/levodopa 50/200 at bed.   I have received numerous phone calls from her and [facility] since our last visit.  Records have been reviewed.  She was in [facility] emergency room on December 06, 2017 after a fall.  CT was nonacute and she was discharged from [facility] emergency room.  I got a call in February from [facility] about episodes of tightness in Nichole patient's diaphragm associated with shortness of breath (and have subsequently gotten several calls about this).  She had previously discussed with me similar episodes, as documented in other notes.  I did not think that these are related to Parkinson's disease, and had previously offered ambulatory EEG but she canceled it.  She did end up having that done on February 28 and it was normal.  This was done after [facility] had called and said that she has had episodes of mental status change, even with sternal rub.  I did recommend cardiology follow-up.  She was previously seen for paroxysmal atrial fibrillation by cardiology.  Told her that I saw no reason for her to have these from a neurologic standpoint, if vitals were good, which they were per Nichole facility.  Patient was back in [facility] emergency room on Mar 29, 2018 for spasms in her entire body.  She has been there previously for that.  Work-up was negative, including cardiac work-up.   She does have freezing of gait but only 2-3 times total since last visit.  Some occasional trouble with "morning on."  08/14/18 update: Patient is seen today in follow-up for Parkinson's disease.  She is on carbidopa/levodopa 25/100, 2 tablets at 6 AM/9 AM/noon/3 PM and another half tablet at 6 PM and a second half tablet in Nichole middle of Nichole night.  She does state that medication wears off in Nichole evening and think that she needs more than 1/2 tablet.  She also takes carbidopa/levodopa 50/200 at bedtime.  She is also on selegeline 5 mg bid.  She admits to hallucinations but they may not be that.  States that she has them "while sleeping."  She cannot clearly tell me if they are part of dream or part of wake state.  "they seem to have a religious connotation."  She does state that she doesn't have them during Nichole daytime.   She was given and approved for Inbrija last visit, but [facility] has a policy that Nichole patient needed to have Nichole medication in a lock box and, because of Nichole Parkinson's disease, she is just unable to open a lock box.  She can have them storage, but by Nichole time she calls them and they come, Nichole freezing event is over.  She did call me in July to state that she had had an episode where her "memory was completely gone."  She called Nichole police but did not remember doing it.  She had just been diagnosed with pneumonia as well.  I thought that Nichole symptom was likely related to that as opposed to Parkinson's itself.  PREVIOUS MEDICATIONS: Sinemet and MirapexRytary (hallucinations); amantadine (hallucinations); stalevo (too expensive);   ALLERGIES:   Allergies  Allergen Reactions  . Other Other (See Comments)    ANESTHESIA-HALLUCINATIONS  . Thorazine [Chlorpromazine] Other (See Comments)    Unknown    CURRENT MEDICATIONS:  Outpatient Encounter Medications as of 08/14/2018  Medication Sig  . acetaminophen (TYLENOL) 500 MG tablet Take 1,000 mg by  mouth every 6 (six) hours as needed for  headache.  . ALPRAZolam (XANAX) 0.25 MG tablet Take 1 tablet (0.25 mg total) by mouth daily as needed for anxiety.  Marland Kitchen apixaban (ELIQUIS) 5 MG TABS tablet Take 5 mg by mouth 2 (two) times daily.  . baclofen (LIORESAL) 10 MG tablet Take 1 tablet (10 mg total) by mouth 3 (three) times daily as needed for muscle spasms (leg muscle spasms). (Patient taking differently: Take 10 mg by mouth 3 (three) times daily. )  . carbidopa-levodopa (SINEMET CR) 50-200 MG tablet Take 1 tablet by mouth at bedtime.  . carbidopa-levodopa (SINEMET IR) 25-100 MG tablet 2 tablets at 6 am, 2 tablets at 9 am, 2 tablets at 12 pm, 2 tablets at 3 pm, 1 PRN at 5 pm  . cyanocobalamin (,VITAMIN B-12,) 1000 MCG/ML injection Inject 1 mL (1,000 mcg total) into Nichole muscle every 30 (thirty) days.  . fluorometholone (FML) 0.1 % ophthalmic ointment 1 application 4 (four) times daily.  . Levodopa (INBRIJA) 42 MG CAPS Place 1 capsule into inhaler and inhale as needed.  . magnesium hydroxide (MILK OF MAGNESIA) 400 MG/5ML suspension Take 5 mLs by mouth as needed for mild constipation.  . Multiple Vitamin (MULTI-VITAMINS) TABS Take 1 tablet by mouth daily.  . selegiline (ELDEPRYL) 5 MG capsule TAKE 1 TABLET ( 5MG  TOTALLY) BY MOUTH TWICE DAILY AT 9 AM AND 9PM   No facility-administered encounter medications on file as of 08/14/2018.     PAST MEDICAL HISTORY:   Past Medical History:  Diagnosis Date  . Anxiety   . Atrial fibrillation (HCC)   . Macular degeneration   . Parkinson's disease (HCC)   . Pulmonary embolism (HCC)     PAST SURGICAL HISTORY:   Past Surgical History:  Procedure Laterality Date  . BACK SURGERY  2017   x3  . CATARACT EXTRACTION Right   . EXTERNAL FIXATION ARM      SOCIAL HISTORY:   Social History   Socioeconomic History  . Marital status: Widowed    Spouse name: Not on file  . Number of children: Not on file  . Years of education: Not on file  . Highest education level: Not on file  Occupational  History  . Not on file  Social Needs  . Financial resource strain: Not on file  . Food insecurity:    Worry: Not on file    Inability: Not on file  . Transportation needs:    Medical: Not on file    Non-medical: Not on file  Tobacco Use  . Smoking status: Never Smoker  . Smokeless tobacco: Never Used  Substance and Sexual Activity  . Alcohol use: Yes    Comment: occasional wine  . Drug use: No  . Sexual activity: Not on file  Lifestyle  . Physical activity:    Days per week: Not on file    Minutes per session: Not on file  . Stress: Not on file  Relationships  . Social connections:    Talks on phone: Not on file    Gets together: Not on file    Attends religious service: Not on file    Active member of club or organization: Not on file    Attends meetings of clubs or organizations: Not on file    Relationship status: Not on file  . Intimate partner violence:    Fear of current or ex partner: Not on file    Emotionally abused: Not on file  Physically abused: Not on file    Forced sexual activity: Not on file  Other Topics Concern  . Not on file  Social History Narrative  . Not on file    FAMILY HISTORY:   Family Status  Relation Name Status  . Mother  Deceased  . Father  Deceased  . Sister  (Not Specified)  . Son x2 Alive    ROS:  Review of Systems  Constitutional: Negative.   HENT: Negative.   Eyes: Negative.   Respiratory: Negative.   Cardiovascular: Negative.   Gastrointestinal: Negative.   Skin: Negative.      PHYSICAL EXAMINATION:    VITALS:   Vitals:   08/14/18 1315  BP: 130/70  Pulse: 82  Resp: 16  SpO2: 98%    GEN:  Nichole patient appears stated age and is in NAD. HEENT:  Normocephalic, atraumatic.  Nichole mucous membranes are moist. Nichole superficial temporal arteries are without ropiness or tenderness. CV:  RRR Lungs:  CTAB Neck/HEME:  There are no carotid bruits bilaterally.  Neurological examination:  Orientation:  Montreal  Cognitive Assessment  07/24/2017  Visuospatial/ Executive (0/5) 5  Naming (0/3) 2  Attention: Read list of digits (0/2) 2  Attention: Read list of letters (0/1) 1  Attention: Serial 7 subtraction starting at 100 (0/3) 0  Language: Repeat phrase (0/2) 2  Language : Fluency (0/1) 1  Abstraction (0/2) 2  Delayed Recall (0/5) 2  Orientation (0/6) 6  Total 23  Adjusted Score (based on education) 23   Orientation: Nichole patient is alert and oriented x3. Cranial nerves: There is good facial symmetry. Nichole speech is fluent and clear. Soft palate rises symmetrically and there is no tongue deviation. Hearing is intact to conversational tone. Sensation: Sensation is intact to light touch throughout Motor: Strength is 5/5 in Nichole bilateral upper and lower extremities.   Shoulder shrug is equal and symmetric.  There is no pronator drift.  Movement examination: Tone: There is normal tone in Nichole bilateral upper extremities.  Nichole tone in Nichole lower extremities is normal.  Abnormal movements: There is mod axial dyskinesia and bilateral UE and LLE Coordination:  There is good RAMs with any form of RAMS, including alternating supination and pronation of Nichole forearm, hand opening and closing (mild trouble with this only due to arthritis), finger taps, heel taps and toe taps.. Gait and Station: she was given a routine walker and had trouble with that.  She did better with Nichole U step walker.  She is wide based and ataxic.  Labs:  Her B12 on 04/02/2017 at [facility] was 290    ASSESSMENT/PLAN:  1.  Idiopathic Parkinson's disease.  Nichole patient was dx in 1998.    -while patient has done incredibly well, she is bothered by on dyskinesia and off dystonia.  Amantadine has caused hallucinations in Nichole past.  Marilynne Drivers has talked to her extensively about DBS therapy as well as duopa.  I've addressed as well She doesn't feel that she could handle mentally DBS and doesn't think that she could physically handle Nichole duopa tube.   She has been previously given literature on both of these topics.  We talked about Nichole pump patch system in trial.    -unable to take inbrija due to requirement from facility that this needs to be in a lockbox and she cannot open Nichole lockbox.  She didn't think that Nichole samples were helpful either.  -She will continue carbidopa/levodopa 25/100, 2 tablets at 6 AM/9 AM/noon/3 PM  and will increase 6 pm dosage to 1 full tablet.  Takes 1/2 tablet at 3am.    -She will continue carbidopa/levodopa 50/200 at bedtime.   -We discussed that it used to be thought that levodopa would increase risk of melanoma but now it is believed that Parkinsons itself likely increases risk of melanoma. she is to get regular skin checks.  -I'm not so sure that Nichole "hallucinations" she describes are true hallucinations or if they are a part of vivid dreams.  Will stop selegeline.  Pt agreeable  -talked about U step and she wants to hold on that for now  -RX for PT at [facility]  2.  B12 deficiency  -on b12 injections  3.  Facial droop  -Nichole records that were made available to me were reviewed and thus far, work up for stroke negative and when she has this she has missed her carbidopa/levodopa.  I wonder if this represents off dystonia.  Nichole neurohospitalist reported low suspicion for stroke  4.  Abdominal pain/spasms and diffuse, full body muscle spasm.  -Explained to her that I do not think that these are from Parkinson's disease.  Explained that while Parkinson's disease can cause cramping (often times in Nichole feet and leg at night), it is otherwise not a painful disease and does not cause diffuse full body muscle spasms.  I told her that she would need to explore this etiology/solution with a different physician.  She will start with her primary care physician.  5.  Follow up is anticipated in Nichole next few months, sooner should new neurologic issues arise.  Much greater than 50% of this visit was spent in counseling and  coordinating care.  Total face to face time:  25 min   Cc:  Nichole Laughter, MD

## 2018-08-14 ENCOUNTER — Ambulatory Visit (INDEPENDENT_AMBULATORY_CARE_PROVIDER_SITE_OTHER): Payer: Medicare Other | Admitting: Neurology

## 2018-08-14 ENCOUNTER — Encounter: Payer: Self-pay | Admitting: Neurology

## 2018-08-14 VITALS — BP 130/70 | HR 82 | Resp 16

## 2018-08-14 DIAGNOSIS — G20 Parkinson's disease: Secondary | ICD-10-CM

## 2018-08-14 DIAGNOSIS — G249 Dystonia, unspecified: Secondary | ICD-10-CM | POA: Diagnosis not present

## 2018-08-14 DIAGNOSIS — R441 Visual hallucinations: Secondary | ICD-10-CM | POA: Diagnosis not present

## 2018-08-14 NOTE — Addendum Note (Signed)
Addended bySilvio Pate on: 08/14/2018 01:38 PM   Modules accepted: Orders

## 2018-08-14 NOTE — Patient Instructions (Addendum)
1  Decrease selegeline to 5 mg once per day for 2 weeks and then will stop the medication  2.  continue carbidopa/levodopa 25/100, 2 tablets at 6 AM/9 AM/noon/3 PM and will increase 6 pm dosage to 1 full tablet.  Continue 1/2 tablet at 3am.    3.  When you have a "spasm" you can take an extra carbidopa/levodopa 25/100 dissolved in ginger ale or chew that.    4.  RX for physical therapy given to you today.

## 2018-08-19 ENCOUNTER — Telehealth: Payer: Self-pay | Admitting: Neurology

## 2018-08-19 NOTE — Telephone Encounter (Signed)
Tried to call patient with no answer and no vm.  

## 2018-08-19 NOTE — Telephone Encounter (Signed)
Patient called and left a voicemail that she was needing some clarification on what medication she was supposed to stop taking. Please call her back at [phone]. Thanks!

## 2018-08-25 ENCOUNTER — Ambulatory Visit
Admission: RE | Admit: 2018-08-25 | Discharge: 2018-08-25 | Disposition: A | Discharge status: Home / Self Care | Payer: Medicare Other | Source: Ambulatory Visit | Attending: *Deleted | Admitting: *Deleted

## 2018-08-25 ENCOUNTER — Other Ambulatory Visit: Payer: Self-pay | Admitting: *Deleted

## 2018-08-25 DIAGNOSIS — T84296A Other mechanical complication of internal fixation device of vertebrae, initial encounter: Secondary | ICD-10-CM

## 2018-09-08 ENCOUNTER — Telehealth: Payer: Self-pay | Admitting: Neurology

## 2018-09-08 NOTE — Telephone Encounter (Signed)
Patient left a message for Jade on the VM she has some questions and would like to talk to someone

## 2018-10-06 ENCOUNTER — Telehealth: Payer: Self-pay | Admitting: Neurology

## 2018-10-06 NOTE — Telephone Encounter (Signed)
Patient called and would like to speak with you regarding her wanting to give up. She cannot take having these episodes all day anymore. Please Call. Thanks

## 2018-10-06 NOTE — Telephone Encounter (Signed)
Dr. Tat - FYI. 

## 2018-10-06 NOTE — Telephone Encounter (Signed)
Patient called back. She thanked me for the concern, but states she is not suicidal. She is in a lot of pain. She had a back fracture since her last visit. She is being treated by Orthopaedics and the MD at her facility, but didn't know if she should take pain medication. I did let her know narcotics could cause some cognitive issues. She will proceed with caution and discuss with managing doctors.  Dr. Arbutus Leasat Lorain Childes- FYI.

## 2018-10-06 NOTE — Telephone Encounter (Signed)
Tried to call patient with no answer. I am contacting facility where patient lives.   Tried to call [facility], I was transferred to a voicemail 3 times even after I relayed the nature of the call and the emergent need of someone to check on the patient/resident.   After the third transfer to a voicemail I called 911 to go check on patient to make sure she was not suicidal/make sure she was okay.

## 2018-12-07 ENCOUNTER — Telehealth: Payer: Self-pay | Admitting: Pulmonary Disease

## 2018-12-07 ENCOUNTER — Ambulatory Visit
Admission: RE | Admit: 2018-12-07 | Discharge: 2018-12-07 | Disposition: A | Discharge status: Home / Self Care | Payer: Medicare Other | Source: Ambulatory Visit | Attending: Pulmonary Disease | Admitting: Pulmonary Disease

## 2018-12-07 ENCOUNTER — Ambulatory Visit (INDEPENDENT_AMBULATORY_CARE_PROVIDER_SITE_OTHER): Payer: Medicare Other | Admitting: Pulmonary Disease

## 2018-12-07 ENCOUNTER — Ambulatory Visit (INDEPENDENT_AMBULATORY_CARE_PROVIDER_SITE_OTHER)
Admission: RE | Admit: 2018-12-07 | Discharge: 2018-12-07 | Disposition: A | Discharge status: Home / Self Care | Payer: Medicare Other | Source: Ambulatory Visit | Attending: Pulmonary Disease | Admitting: Pulmonary Disease

## 2018-12-07 ENCOUNTER — Encounter: Payer: Self-pay | Admitting: Pulmonary Disease

## 2018-12-07 VITALS — BP 122/64 | HR 92 | Ht 62.0 in | Wt 126.0 lb

## 2018-12-07 DIAGNOSIS — R0602 Shortness of breath: Secondary | ICD-10-CM

## 2018-12-07 NOTE — Telephone Encounter (Signed)
Done  Put immunizations into Epic  Margie aware

## 2018-12-07 NOTE — Telephone Encounter (Signed)
Lm for nurse supervisor at [facility] to obtain flu and PNA vaccine dates.

## 2018-12-07 NOTE — Patient Instructions (Signed)
We will get a chest x-ray today We will schedule you for pulmonary function test Follow-up in clinic after PFTs

## 2018-12-07 NOTE — Progress Notes (Signed)
Nichole Krueger    [id number]    05/31/1936  Primary Care Physician:Stoneking, Ann Maki, MD  Referring Physician: [name], MD [address] [place], [place] [address]  Chief complaint: Consult for dyspnea  HPI: 83 year old with history of atrial fibrillation, Parkinson's, hyperlipidemia, PE Complains of dyspnea for the past 3 years.  Associated with chest congestion.  No cough, sputum production, wheezing She describes episodes of dyspnea, gasping for air along with spasms of chest muscle.  This occurs when she is having Parkinson hallucinations  History noted for pulmonary embolism during spinal surgery around 2017 in [place].  She is on chronic anticoagulation for this.  She follows with Dr. Arbutus Leas, neurology for management of Parkinson's.  The patient tells me that her tremor symptoms and hallucinations have been worsening over the past few years.  Pets: No pets Occupation: Retired Librarian, academic Exposures: She lives in [place].  Feels that there is mold in her living space.  No hot tub, Jacuzzi Smoking history: Never smoker Travel history: Originally from [place].  Moved to [place] 3 years ago Relevant family history: No significant family history of lung disease.  Outpatient Encounter Medications as of 12/07/2018  Medication Sig  . acetaminophen (TYLENOL) 500 MG tablet Take 1,000 mg by mouth every 6 (six) hours as needed for headache.  . ALPRAZolam (XANAX) 0.25 MG tablet Take 1 tablet (0.25 mg total) by mouth daily as needed for anxiety.  Marland Kitchen apixaban (ELIQUIS) 5 MG TABS tablet Take 5 mg by mouth 2 (two) times daily.  . baclofen (LIORESAL) 10 MG tablet Take 1 tablet (10 mg total) by mouth 3 (three) times daily as needed for muscle spasms (leg muscle spasms). (Patient taking differently: Take 10 mg by mouth 3 (three) times daily. )  . carbidopa-levodopa (SINEMET CR) 50-200 MG tablet Take 1 tablet by mouth at bedtime.  . carbidopa-levodopa  (SINEMET IR) 25-100 MG tablet 2 tablets at 6 am, 2 tablets at 9 am, 2 tablets at 12 pm, 2 tablets at 3 pm, 1 PRN at 5 pm  . cyanocobalamin (,VITAMIN B-12,) 1000 MCG/ML injection Inject 1 mL (1,000 mcg total) into the muscle every 30 (thirty) days.  . fluorometholone (FML) 0.1 % ophthalmic ointment 1 application 4 (four) times daily.  Marland Kitchen guaiFENesin (MUCINEX) 600 MG 12 hr tablet Take by mouth 2 (two) times daily.  . Levodopa (INBRIJA) 42 MG CAPS Place 1 capsule into inhaler and inhale as needed.  . loratadine (CLARITIN) 10 MG tablet Take by mouth.  . magnesium hydroxide (MILK OF MAGNESIA) 400 MG/5ML suspension Take 5 mLs by mouth as needed for mild constipation.  . Multiple Vitamin (MULTI-VITAMINS) TABS Take 1 tablet by mouth daily.  Marland Kitchen Propylene Glycol 0.6 % SOLN 1 drop.  . selegiline (ELDEPRYL) 5 MG capsule TAKE 1 TABLET ( 5MG  TOTALLY) BY MOUTH TWICE DAILY AT 9 AM AND 9PM   No facility-administered encounter medications on file as of 12/07/2018.     Allergies as of 12/07/2018 - Review Complete 12/07/2018  Allergen Reaction Noted  . Other Other (See Comments) 01/31/2017  . Thorazine [chlorpromazine] Other (See Comments) 01/31/2017    Past Medical History:  Diagnosis Date  . Anxiety   . Atrial fibrillation (HCC)   . Macular degeneration   . Parkinson's disease (HCC)   . Pulmonary embolism [facility])     Past Surgical History:  Procedure Laterality Date  . BACK SURGERY  2017   x3  .  CATARACT EXTRACTION Right   . EXTERNAL FIXATION ARM      Family History  Problem Relation Age of Onset  . Stroke Mother   . Heart attack Father   . Heart attack Sister     Social History   Socioeconomic History  . Marital status: Widowed    Spouse name: Not on file  . Number of children: Not on file  . Years of education: Not on file  . Highest education level: Not on file  Occupational History  . Not on file  Social Needs  . Financial resource strain: Not on file  . Food insecurity:     Worry: Not on file    Inability: Not on file  . Transportation needs:    Medical: Not on file    Non-medical: Not on file  Tobacco Use  . Smoking status: Never Smoker  . Smokeless tobacco: Never Used  Substance and Sexual Activity  . Alcohol use: Yes    Comment: occasional wine  . Drug use: No  . Sexual activity: Not on file  Lifestyle  . Physical activity:    Days per week: Not on file    Minutes per session: Not on file  . Stress: Not on file  Relationships  . Social connections:    Talks on phone: Not on file    Gets together: Not on file    Attends religious service: Not on file    Active member of club or organization: Not on file    Attends meetings of clubs or organizations: Not on file    Relationship status: Not on file  . Intimate partner violence:    Fear of current or ex partner: Not on file    Emotionally abused: Not on file    Physically abused: Not on file    Forced sexual activity: Not on file  Other Topics Concern  . Not on file  Social History Narrative  . Not on file    Review of systems: Review of Systems  Constitutional: Negative for fever and chills.  HENT: Negative.   Eyes: Negative for blurred vision.  Respiratory: as per HPI  Cardiovascular: Negative for chest pain and palpitations.  Gastrointestinal: Negative for vomiting, diarrhea, blood per rectum. Genitourinary: Negative for dysuria, urgency, frequency and hematuria.  Musculoskeletal: Negative for myalgias, back pain and joint pain.  Skin: Negative for itching and rash.  Neurological: Negative for dizziness, tremors, focal weakness, seizures and loss of consciousness.  Endo/Heme/Allergies: Negative for environmental allergies.  Psychiatric/Behavioral: Negative for depression, suicidal ideas and hallucinations.  All other systems reviewed and are negative.  Physical Exam: Blood pressure 122/64, pulse 92, height 5\' 2"  (1.575 m), weight 126 lb (57.2 kg), SpO2 96 %. Gen:      No acute  distress HEENT:  EOMI, sclera anicteric Neck:     No masses; no thyromegaly Lungs:    Clear to auscultation bilaterally; normal respiratory effort CV:         Regular rate and rhythm; no murmurs Abd:      + bowel sounds; soft, non-tender; no palpable masses, no distension Ext:    No edema; adequate peripheral perfusion Skin:      Warm and dry; no rash Neuro: alert and oriented x 3 Psych: normal mood and affect  Data Reviewed: Imaging: Chest x-ray 03/29/2018- thoracolumbar spinal fusion rods with chronic compression deformities.  Here with no acute cardiopulmonary process.  I reviewed the images personally.  PFTs:  Pending  Labs: CBC  03/29/2018-WBC 3.7, eos 1%, absolute eosinophil count 37  Assessment:  Evaluation for dyspnea, congestion Not sure if she has any intrinsic lung issue.  It is possible that her episodes of dyspnea could be related to Parkinson's We will evaluate with chest x-ray and pulmonary function test  Plan/Recommendations: - Chest x-ray, PFTs.  Chilton Greathouse MD [facility] 12/07/2018, 2:15 PM  CC: [name], MD

## 2018-12-07 NOTE — Telephone Encounter (Signed)
Misty Stanley from [facility] [place] [facility] calling back.  States had flu vaccine on 09/03/2018 and pneumo 23 on 02/16/2009 and prevnar 13 on 12/03/2016.  CB with any questions [phone].

## 2018-12-08 ENCOUNTER — Ambulatory Visit (INDEPENDENT_AMBULATORY_CARE_PROVIDER_SITE_OTHER): Payer: Medicare Other | Admitting: Pulmonary Disease

## 2018-12-08 ENCOUNTER — Encounter: Payer: Self-pay | Admitting: Pulmonary Disease

## 2018-12-08 VITALS — BP 134/60 | HR 95 | Ht 64.5 in | Wt 126.0 lb

## 2018-12-08 DIAGNOSIS — R0602 Shortness of breath: Secondary | ICD-10-CM

## 2018-12-08 LAB — PULMONARY FUNCTION TEST
DL/VA % pred: 74 %
DL/VA: 3.62 ml/min/mmHg/L
DLCO unc % pred: 54 %
DLCO unc: 13.51 ml/min/mmHg
FEF 25-75 Post: 1.28 L/sec
FEF 25-75 Pre: 0.85 L/sec
FEF2575-%Change-Post: 49 %
FEF2575-%Pred-Post: 88 %
FEF2575-%Pred-Pre: 59 %
FEV1-%Change-Post: 12 %
FEV1-%PRED-POST: 84 %
FEV1-%PRED-PRE: 75 %
FEV1-Post: 1.68 L
FEV1-Pre: 1.5 L
FEV1FVC-%Change-Post: 9 %
FEV1FVC-%PRED-PRE: 90 %
FEV6-%Change-Post: 3 %
FEV6-%Pred-Post: 90 %
FEV6-%Pred-Pre: 87 %
FEV6-Post: 2.28 L
FEV6-Pre: 2.2 L
FEV6FVC-%Change-Post: 1 %
FEV6FVC-%Pred-Post: 105 %
FEV6FVC-%Pred-Pre: 104 %
FVC-%Change-Post: 2 %
FVC-%Pred-Post: 85 %
FVC-%Pred-Pre: 83 %
FVC-Post: 2.28 L
FVC-Pre: 2.23 L
Post FEV1/FVC ratio: 73 %
Post FEV6/FVC ratio: 100 %
Pre FEV1/FVC ratio: 67 %
Pre FEV6/FVC Ratio: 99 %
RV % pred: 114 %
RV: 2.78 L
TLC % pred: 94 %
TLC: 4.83 L

## 2018-12-08 LAB — CBC WITH DIFFERENTIAL/PLATELET
Basophils Absolute: 0 10*3/uL (ref 0.0–0.1)
Basophils Relative: 0.9 % (ref 0.0–3.0)
Eosinophils Absolute: 0.1 10*3/uL (ref 0.0–0.7)
Eosinophils Relative: 1.8 % (ref 0.0–5.0)
HCT: 30.1 % — ABNORMAL LOW (ref 36.0–46.0)
Hemoglobin: 10.2 g/dL — ABNORMAL LOW (ref 12.0–15.0)
Lymphocytes Relative: 34.1 % (ref 12.0–46.0)
Lymphs Abs: 1.1 10*3/uL (ref 0.7–4.0)
MCHC: 33.8 g/dL (ref 30.0–36.0)
MCV: 100.7 fl — ABNORMAL HIGH (ref 78.0–100.0)
Monocytes Absolute: 0.3 10*3/uL (ref 0.1–1.0)
Monocytes Relative: 9 % (ref 3.0–12.0)
Neutro Abs: 1.7 10*3/uL (ref 1.4–7.7)
Neutrophils Relative %: 54.2 % (ref 43.0–77.0)
Platelets: 224 10*3/uL (ref 150.0–400.0)
RBC: 2.99 Mil/uL — ABNORMAL LOW (ref 3.87–5.11)
RDW: 16 % — AB (ref 11.5–15.5)
WBC: 3.1 10*3/uL — ABNORMAL LOW (ref 4.0–10.5)

## 2018-12-08 MED ORDER — BUDESONIDE 0.5 MG/2ML IN SUSP: 0.5000 mg | Freq: Two times a day (BID) | RESPIRATORY_TRACT | 12 refills | Status: AC

## 2018-12-08 MED ORDER — FORMOTEROL FUMARATE 20 MCG/2ML IN NEBU: INHALATION_SOLUTION | RESPIRATORY_TRACT | 12 refills | Status: AC

## 2018-12-08 NOTE — Addendum Note (Signed)
Addended by: Ebony Cargo on: 12/08/2018 02:11 PM   Modules accepted: Orders

## 2018-12-08 NOTE — Patient Instructions (Addendum)
I have reviewed your PFTs which show mild changes which is consistent with asthma We will check a CBC differential, IgE level today We will start you on performist nebs twice daily and Pulmicort 0.5 mg twice daily nebs Follow-up in 3 months.

## 2018-12-08 NOTE — Progress Notes (Signed)
Nichole Krueger    [id number]    [birth year]  Primary Care Physician:Stoneking, Ann Maki, MD  Referring Physician: [name], MD [address] [place], [place] [address]  Chief complaint: Follow-up for dyspnea  HPI: 83 year old with history of atrial fibrillation, Parkinson's, hyperlipidemia, PE Complains of dyspnea for the past 3 years.  Associated with chest congestion.  No cough, sputum production, wheezing She describes episodes of dyspnea, gasping for air along with spasms of chest muscle.  This occurs when she is having Parkinson hallucinations  History noted for pulmonary embolism during spinal surgery around 2017 in [place].  She is on chronic anticoagulation for this.  She follows with Dr. Arbutus Leas, neurology for management of Parkinson's.  The patient tells me that her tremor symptoms and hallucinations have been worsening over the past few years.  Pets: No pets Occupation: Retired Librarian, academic Exposures: She lives in [place].  Feels that there is mold in her living space.  No hot tub, Jacuzzi Smoking history: Never smoker Travel history: Originally from [place].  Moved to [place] 3 years ago Relevant family history: No significant family history of lung disease.  Outpatient Encounter Medications as of 12/08/2018  Medication Sig  . acetaminophen (TYLENOL) 500 MG tablet Take 1,000 mg by mouth every 6 (six) hours as needed for headache.  . ALPRAZolam (XANAX) 0.25 MG tablet Take 1 tablet (0.25 mg total) by mouth daily as needed for anxiety.  Marland Kitchen apixaban (ELIQUIS) 5 MG TABS tablet Take 5 mg by mouth 2 (two) times daily.  . baclofen (LIORESAL) 10 MG tablet Take 1 tablet (10 mg total) by mouth 3 (three) times daily as needed for muscle spasms (leg muscle spasms). (Patient taking differently: Take 10 mg by mouth 3 (three) times daily. )  . carbidopa-levodopa (SINEMET CR) 50-200 MG tablet Take 1 tablet by mouth at bedtime.  . carbidopa-levodopa  (SINEMET IR) 25-100 MG tablet 2 tablets at 6 am, 2 tablets at 9 am, 2 tablets at 12 pm, 2 tablets at 3 pm, 1 PRN at 5 pm  . cyanocobalamin (,VITAMIN B-12,) 1000 MCG/ML injection Inject 1 mL (1,000 mcg total) into the muscle every 30 (thirty) days.  . fluorometholone (FML) 0.1 % ophthalmic ointment 1 application 4 (four) times daily.  Marland Kitchen guaiFENesin (MUCINEX) 600 MG 12 hr tablet Take by mouth 2 (two) times daily.  . Levodopa (INBRIJA) 42 MG CAPS Place 1 capsule into inhaler and inhale as needed.  . loratadine (CLARITIN) 10 MG tablet Take by mouth.  . magnesium hydroxide (MILK OF MAGNESIA) 400 MG/5ML suspension Take 5 mLs by mouth as needed for mild constipation.  . Multiple Vitamin (MULTI-VITAMINS) TABS Take 1 tablet by mouth daily.  Marland Kitchen Propylene Glycol 0.6 % SOLN 1 drop.  . selegiline (ELDEPRYL) 5 MG capsule TAKE 1 TABLET ( 5MG  TOTALLY) BY MOUTH TWICE DAILY AT 9 AM AND 9PM   No facility-administered encounter medications on file as of 12/08/2018.    Physical Exam: Blood pressure 134/60, pulse 95, height 5' 4.5" (1.638 m), weight 126 lb (57.2 kg), SpO2 94 %. Gen:      No acute distress HEENT:  EOMI, sclera anicteric Neck:     No masses; no thyromegaly Lungs:    Clear to auscultation bilaterally; normal respiratory effort CV:         Regular rate and rhythm; no murmurs Abd:      + bowel sounds; soft, non-tender; no palpable masses, no distension  Ext:    No edema; adequate peripheral perfusion Skin:      Warm and dry; no rash Neuro: alert and oriented x 3 Psych: normal mood and affect  Data Reviewed: Imaging: Chest x-ray 03/29/2018- thoracolumbar spinal fusion rods with chronic compression deformities.  Here with no acute cardiopulmonary process.  I reviewed the images personally.  Chest x-ray 12/07/2018- spinal fusion rods, cardiomegaly, clear lungs with no acute cardiopulmonary abnormality, small hiatal hernia I have reviewed the images personally  PFTs:  12/08/2018-FVC 2.28 [85%], FEV1  1.68 [84%), F/F 73, TLC 94%, RV/TLC 120%, DLCO 54% Moderate obstruction with mild improvement in flow rates post bronchodilator, moderate diffusion defect, air-trapping  Labs: CBC 03/29/2018-WBC 3.7, eos 1%, absolute eosinophil count 37  Assessment:  Evaluation for dyspnea, congestion Likely has asthma PFTs reviewed with moderate obstruction and improvement in flow rates post bronchodilator We will start her on Perforomist and Pulmicort nebs Check CBC differential, IgE.  Plan/Recommendations: - Start Perforomist, Pulmicort neb - Check CBC differential, IgE  Follow-up in 3 months  Chilton GreathousePraveen Janiesha Diehl MD [facility] 12/08/2018, 1:48 PM  CC: Merlene LaughterStoneking, Hal, MD

## 2018-12-08 NOTE — Progress Notes (Signed)
PFT done today. 

## 2018-12-09 LAB — IGE: IgE (Immunoglobulin E), Serum: 14 kU/L (ref <=114)

## 2019-01-02 ENCOUNTER — Encounter (HOSPITAL_COMMUNITY): Payer: Self-pay | Admitting: Nurse Practitioner

## 2019-01-02 ENCOUNTER — Emergency Department (HOSPITAL_COMMUNITY)
Admission: EM | Admit: 2019-01-02 | Discharge: 2019-01-03 | Disposition: A | Discharge status: Home / Self Care | Payer: Medicare Other | Attending: Emergency Medicine | Admitting: Emergency Medicine

## 2019-01-02 ENCOUNTER — Emergency Department (HOSPITAL_COMMUNITY): Payer: Medicare Other

## 2019-01-02 DIAGNOSIS — M79642 Pain in left hand: Secondary | ICD-10-CM | POA: Diagnosis present

## 2019-01-02 DIAGNOSIS — M19042 Primary osteoarthritis, left hand: Secondary | ICD-10-CM | POA: Insufficient documentation

## 2019-01-02 DIAGNOSIS — G20 Parkinson's disease: Secondary | ICD-10-CM | POA: Insufficient documentation

## 2019-01-02 DIAGNOSIS — Z79899 Other long term (current) drug therapy: Secondary | ICD-10-CM | POA: Insufficient documentation

## 2019-01-02 NOTE — ED Notes (Signed)
Bed: BM21 Expected date:  Expected time:  Means of arrival:  Comments: EMS 83 yo from SNF left hand pain x 2 hours-no trauma or fall

## 2019-01-02 NOTE — ED Triage Notes (Signed)
Pt is coming from [facility] home via EMS, reportedly called 911 /o notifying facility staff, c/o left hand pain, denies fall or any kind of trauma.

## 2019-01-03 DIAGNOSIS — M19042 Primary osteoarthritis, left hand: Secondary | ICD-10-CM | POA: Diagnosis not present

## 2019-01-03 LAB — URIC ACID: URIC ACID, SERUM: 3.6 mg/dL (ref 2.5–7.1)

## 2019-01-03 MED ORDER — TRAMADOL HCL 50 MG PO TABS: 50.0000 mg | ORAL_TABLET | Freq: Four times a day (QID) | ORAL | 0 refills | Status: AC | PRN

## 2019-01-03 MED ORDER — TRAMADOL HCL 50 MG PO TABS
50.0000 mg | ORAL_TABLET | Freq: Once | ORAL | Status: AC
  Administered 2019-01-03: 50 mg via ORAL
  Filled 2019-01-03: qty 1

## 2019-01-03 MED ORDER — ACETAMINOPHEN 500 MG PO TABS
500.0000 mg | ORAL_TABLET | Freq: Once | ORAL | Status: AC
  Administered 2019-01-03: 500 mg via ORAL
  Filled 2019-01-03: qty 1

## 2019-01-03 NOTE — Discharge Instructions (Addendum)
Take the tramadol with acetaminophen 500 mg 4 times a day for pain.  Try heat for comfort.  Please call Dr. Merrilee Seashore office to be rechecked if you are not improving.  Your gout test was negative, you do not have gout.

## 2019-01-03 NOTE — ED Provider Notes (Signed)
[facility]-EMERGENCY DEPT Provider Note   CSN: [id number] Arrival date & time: 01/02/19  2325  Time seen 12:10 AM   History   Chief Complaint Chief Complaint  Patient presents with  . Hand Pain    Left    HPI Nichole Krueger is a 83 y.o. female.  HPI patient states she has had Parkinson's for many years and about 3 days ago she had a severe shaking episode all over her body.  She states she is also being treated for trigger finger of her left middle finger and has had injections which have helped.  She had them done last about 3 months ago.  This was done by Dr. Merlyn Lot.  She states she now has pain in the IP joint of her left thumb, the MCP joints of her index and middle fingers that radiates up into the distal left forearm, and some pain on her wrist when she supinates.  She states because of her Parkinson's she does have to use grab bars a lot because her balance is bad.  She feels like her joints are more swollen than usual and movement of her fingers causes pain to radiate up into her distal left forearm.  She denies any prior history of gout.  She denies any known trauma.  She has osteoarthritis and has rods in her back.  Patient is on Eliquis for atrial fibrillation complicated by PE.  PCP [name], MD Hand Ortho Dr Merlyn Lot    Past Medical History:  Diagnosis Date  . Anxiety   . Atrial fibrillation (HCC)   . Macular degeneration   . Parkinson's disease (HCC)   . Pulmonary embolism [facility])     Patient Active Problem List   Diagnosis Date Noted  . Impaired fasting glucose 10/15/2017  . Normocytic anemia 10/15/2017  . Pulmonary arterial hypertension (HCC) 10/15/2017  . Chest pain 10/14/2017  . Orthostatic syncope 05/06/2017  . Syncope 05/04/2017  . Muscle rigidity 01/31/2017  . AF (paroxysmal atrial fibrillation) (HCC) 01/31/2017  . Parkinson's disease (HCC) 01/31/2017    Past Surgical History:  Procedure Laterality Date  . BACK SURGERY  2017    x3  . CATARACT EXTRACTION Right   . EXTERNAL FIXATION ARM       OB History   No obstetric history on file.      Home Medications    Prior to Admission medications   Medication Sig Start Date End Date Taking? Authorizing Provider  acetaminophen (TYLENOL) 500 MG tablet Take 1,000 mg by mouth every 6 (six) hours as needed for headache.    [provider]  ALPRAZolam Prudy Feeler) 0.25 MG tablet Take 1 tablet (0.25 mg total) by mouth daily as needed for anxiety. 10/15/17   Marguerita Merles Latif, DO  apixaban (ELIQUIS) 5 MG TABS tablet Take 5 mg by mouth 2 (two) times daily.    [provider]  baclofen (LIORESAL) 10 MG tablet Take 1 tablet (10 mg total) by mouth 3 (three) times daily as needed for muscle spasms (leg muscle spasms). Patient taking differently: Take 10 mg by mouth 3 (three) times daily.  02/01/17   Rolly Salter, MD  budesonide (PULMICORT) 0.5 MG/2ML nebulizer solution Take 2 mLs (0.5 mg total) by nebulization 2 (two) times daily. 12/08/18   Mannam, Colbert Coyer, MD  carbidopa-levodopa (SINEMET CR) 50-200 MG tablet Take 1 tablet by mouth at bedtime. 08/25/17   Tat, Octaviano Batty, DO  carbidopa-levodopa (SINEMET IR) 25-100 MG tablet 2 tablets at 6 am,  2 tablets at 9 am, 2 tablets at 12 pm, 2 tablets at 3 pm, 1 PRN at 5 pm 08/26/17   Tat, Lurena Joiner S, DO  cyanocobalamin (,VITAMIN B-12,) 1000 MCG/ML injection Inject 1 mL (1,000 mcg total) into the muscle every 30 (thirty) days. 07/24/17   Tat, Octaviano Batty, DO  fluorometholone (FML) 0.1 % ophthalmic ointment 1 application 4 (four) times daily.    [provider]  formoterol (PERFOROMIST) 20 MCG/2ML nebulizer solution 2ml by nebulizer two time daily 12/08/18   Mannam, Praveen, MD  guaiFENesin (MUCINEX) 600 MG 12 hr tablet Take by mouth 2 (two) times daily.    [provider]  Levodopa (INBRIJA) 42 MG CAPS Place 1 capsule into inhaler and inhale as needed. 04/03/18   Tat, Octaviano Batty, DO  loratadine (CLARITIN) 10 MG tablet  Take by mouth.    [provider]  magnesium hydroxide (MILK OF MAGNESIA) 400 MG/5ML suspension Take 5 mLs by mouth as needed for mild constipation.    [provider]  Multiple Vitamin (MULTI-VITAMINS) TABS Take 1 tablet by mouth daily. 05/14/11   [provider]  Propylene Glycol 0.6 % SOLN 1 drop.    [provider]  selegiline (ELDEPRYL) 5 MG capsule TAKE 1 TABLET ( 5MG  TOTALLY) BY MOUTH TWICE DAILY AT 9 AM AND 9PM 01/03/17   [provider]  traMADol (ULTRAM) 50 MG tablet Take 1 tablet (50 mg total) by mouth every 6 (six) hours as needed for moderate pain or severe pain. 01/03/19   Devoria Albe, MD    Family History Family History  Problem Relation Age of Onset  . Stroke Mother   . Heart attack Father   . Heart attack Sister     Social History Social History   Tobacco Use  . Smoking status: Never Smoker  . Smokeless tobacco: Never Used  Substance Use Topics  . Alcohol use: Yes    Comment: occasional wine  . Drug use: No  Patient lives in a nursing home She states she is very rarely ambulatory   Allergies   Other and Thorazine [chlorpromazine]   Review of Systems Review of Systems  All other systems reviewed and are negative.    Physical Exam Updated Vital Signs BP (!) 160/90 (BP Location: Right Arm)   Pulse 93   Temp 98.8 F (37.1 C) (Oral)   Resp 18   SpO2 98%   Vital signs normal except for hypertension   Physical Exam Vitals signs and nursing note reviewed.  Constitutional:      Appearance: Normal appearance.     Comments: Frail elderly female  HENT:     Head: Normocephalic and atraumatic.     Right Ear: External ear normal.     Left Ear: External ear normal.     Nose: Nose normal.  Eyes:     Conjunctiva/sclera: Conjunctivae normal.     Pupils: Pupils are equal, round, and reactive to light.  Neck:     Musculoskeletal: Normal range of motion.  Cardiovascular:     Rate and Rhythm: Normal rate.    Pulmonary:     Effort: Pulmonary effort is normal. No respiratory distress.  Musculoskeletal:        General: Swelling and tenderness present.     Comments: Patient keeps her left thumb flexed at the IP joint and states it hurts in the IP joint which radiates down to the MCP joint of the left thumb.  She also has flexion of her left  index and middle fingers.  She has difficulty trying to extend them, however when I extend the left index finger she states she has some pain in the MCP P joint but she is very tender when I try to extend the left middle finger which radiates from her PIP joint through the MCP joint up into her wrist.  There is mild redness and warmth to the MCP joints however they look similar to the joints on her right hand.  There is no obvious joint effusion felt.  She also appears to have some mild diffuse swelling of her left wrist and states it hurts when she supinates.  Skin:    Capillary Refill: Capillary refill takes less than 2 seconds.  Neurological:     General: No focal deficit present.     Mental Status: She is alert and oriented to person, place, and time.     Comments: Patient is noted to have tremor of her right hand.  She has rare tremor of her left hand.  Psychiatric:        Mood and Affect: Mood normal.        Behavior: Behavior normal.        Thought Content: Thought content normal.      ED Treatments / Results  Labs (all labs ordered are listed, but only abnormal results are displayed) Results for orders placed or performed during the hospital encounter of 01/02/19  Uric acid  Result Value Ref Range   Uric Acid, Serum 3.6 2.5 - 7.1 mg/dL   Laboratory interpretation all normal    EKG None  Radiology Dg Hand Complete Left  Result Date: 01/03/2019 CLINICAL DATA:  Left hand pain, no trauma. EXAM: LEFT HAND - COMPLETE 3+ VIEW COMPARISON:  None. FINDINGS: The second and third proximal interphalangeal joints are held in flexion on all images, limiting  evaluation. There is no definite acute fracture identified. There are degenerative joint changes of the carpal bones. IMPRESSION: Second and third proximal interphalangeal joints are held in flexion, limiting evaluation. No definite acute fracture is identified in the left hand. Electronically Signed   By: Sherian Rein M.D.   On: 01/03/2019 00:06    Procedures Procedures (including critical care time)  Medications Ordered in ED Medications  traMADol (ULTRAM) tablet 50 mg (has no administration in time range)  acetaminophen (TYLENOL) tablet 500 mg (has no administration in time range)     Initial Impression / Assessment and Plan / ED Course  I have reviewed the triage vital signs and the nursing notes.  Pertinent labs & imaging results that were available during my care of the patient were reviewed by me and considered in my medical decision making (see chart for details).    Due to the location of her swollen joints a uric acid level was done.  1:40 AM her uric acid level was normal.  She was given tramadol and acetaminophen for her pain.  She should follow back up with Dr. Merlyn Lot, her hand orthopedist for further  Evaluation.  I suspect she has underlying osteoarthritis.  Final Clinical Impressions(s) / ED Diagnoses   Final diagnoses:  Left hand pain  Osteoarthritis of left hand, unspecified osteoarthritis type    ED Discharge Orders         Ordered    traMADol (ULTRAM) 50 MG tablet  Every 6 hours PRN     01/03/19 0142        OTC   Acetaminophen  Plan discharge  Devoria Albe,  MD, Iline OvenFACEP     Edessa Jakubowicz, Jodelle GrossIva, MD 01/03/19 [phone]

## 2019-01-09 ENCOUNTER — Emergency Department (HOSPITAL_COMMUNITY): Payer: Medicare Other

## 2019-01-09 ENCOUNTER — Emergency Department (HOSPITAL_COMMUNITY)
Admission: EM | Admit: 2019-01-09 | Discharge: 2019-01-10 | Disposition: A | Discharge status: Home / Self Care | Payer: Medicare Other | Attending: Emergency Medicine | Admitting: Emergency Medicine

## 2019-01-09 ENCOUNTER — Encounter (HOSPITAL_COMMUNITY): Payer: Self-pay

## 2019-01-09 DIAGNOSIS — I10 Essential (primary) hypertension: Secondary | ICD-10-CM | POA: Diagnosis not present

## 2019-01-09 DIAGNOSIS — Z79899 Other long term (current) drug therapy: Secondary | ICD-10-CM | POA: Diagnosis not present

## 2019-01-09 DIAGNOSIS — G20 Parkinson's disease: Secondary | ICD-10-CM | POA: Diagnosis not present

## 2019-01-09 DIAGNOSIS — R251 Tremor, unspecified: Secondary | ICD-10-CM | POA: Diagnosis present

## 2019-01-09 LAB — INFLUENZA PANEL BY PCR (TYPE A & B)
INFLAPCR: NEGATIVE
INFLBPCR: NEGATIVE

## 2019-01-09 LAB — TSH: TSH: 0.545 u[IU]/mL (ref 0.350–4.500)

## 2019-01-09 LAB — COMPREHENSIVE METABOLIC PANEL
ALT: 5 U/L (ref 0–44)
AST: 21 U/L (ref 15–41)
Albumin: 3.6 g/dL (ref 3.5–5.0)
Alkaline Phosphatase: 72 U/L (ref 38–126)
Anion gap: 7 (ref 5–15)
BUN: 18 mg/dL (ref 8–23)
CHLORIDE: 107 mmol/L (ref 98–111)
CO2: 25 mmol/L (ref 22–32)
Calcium: 8.8 mg/dL — ABNORMAL LOW (ref 8.9–10.3)
Creatinine, Ser: 0.85 mg/dL (ref 0.44–1.00)
GFR calc Af Amer: >60 mL/min (ref >60)
GFR calc non Af Amer: >60 mL/min (ref >60)
Glucose, Bld: 137 mg/dL — ABNORMAL HIGH (ref 70–99)
Potassium: 4.1 mmol/L (ref 3.5–5.1)
Sodium: 139 mmol/L (ref 135–145)
Total Bilirubin: 0.4 mg/dL (ref 0.3–1.2)
Total Protein: 6.1 g/dL — ABNORMAL LOW (ref 6.5–8.1)

## 2019-01-09 LAB — CBC WITH DIFFERENTIAL/PLATELET
ABS IMMATURE GRANULOCYTES: 0.01 10*3/uL (ref 0.00–0.07)
Basophils Absolute: 0 10*3/uL (ref 0.0–0.1)
Basophils Relative: 1 %
Eosinophils Absolute: 0 10*3/uL (ref 0.0–0.5)
Eosinophils Relative: 1 %
HCT: 32.7 % — ABNORMAL LOW (ref 36.0–46.0)
HEMOGLOBIN: 10.6 g/dL — AB (ref 12.0–15.0)
Immature Granulocytes: 0 %
LYMPHS PCT: 21 %
Lymphs Abs: 0.8 10*3/uL (ref 0.7–4.0)
MCH: 33 pg (ref 26.0–34.0)
MCHC: 32.4 g/dL (ref 30.0–36.0)
MCV: 101.9 fL — ABNORMAL HIGH (ref 80.0–100.0)
Monocytes Absolute: 0.3 10*3/uL (ref 0.1–1.0)
Monocytes Relative: 8 %
Neutro Abs: 2.6 10*3/uL (ref 1.7–7.7)
Neutrophils Relative %: 69 %
Platelets: 205 10*3/uL (ref 150–400)
RBC: 3.21 MIL/uL — ABNORMAL LOW (ref 3.87–5.11)
RDW: 14.3 % (ref 11.5–15.5)
WBC: 3.7 10*3/uL — ABNORMAL LOW (ref 4.0–10.5)
nRBC: 0 % (ref 0.0–0.2)

## 2019-01-09 LAB — I-STAT TROPONIN, ED
Troponin i, poc: 0 ng/mL (ref 0.00–0.08)
Troponin i, poc: 0.01 ng/mL (ref 0.00–0.08)

## 2019-01-09 LAB — BRAIN NATRIURETIC PEPTIDE: B Natriuretic Peptide: 271.2 pg/mL — ABNORMAL HIGH (ref 0.0–100.0)

## 2019-01-09 LAB — PHOSPHORUS: Phosphorus: 3.4 mg/dL (ref 2.5–4.6)

## 2019-01-09 LAB — CK: Total CK: 200 U/L (ref 38–234)

## 2019-01-09 LAB — MAGNESIUM: Magnesium: 2 mg/dL (ref 1.7–2.4)

## 2019-01-09 MED ORDER — CARBIDOPA-LEVODOPA 25-100 MG PO TABS
1.0000 | ORAL_TABLET | Freq: Once | ORAL | Status: DC
  Filled 2019-01-09: qty 1

## 2019-01-09 MED ORDER — ALPRAZOLAM 0.25 MG PO TABS
0.5000 mg | ORAL_TABLET | Freq: Once | ORAL | Status: AC
  Administered 2019-01-09: 0.5 mg via ORAL
  Filled 2019-01-09: qty 2

## 2019-01-09 MED ORDER — MICONAZOLE NITRATE 2 % EX CREA
TOPICAL_CREAM | Freq: Two times a day (BID) | CUTANEOUS | Status: DC
  Filled 2019-01-09: qty 28.4

## 2019-01-09 MED ORDER — SODIUM CHLORIDE 0.9 % IV BOLUS
1000.0000 mL | Freq: Once | INTRAVENOUS | Status: AC
  Administered 2019-01-09: 1000 mL via INTRAVENOUS

## 2019-01-09 MED ORDER — BACLOFEN 10 MG PO TABS
10.0000 mg | ORAL_TABLET | Freq: Once | ORAL | Status: DC
  Filled 2019-01-09: qty 1

## 2019-01-09 MED ORDER — CARBIDOPA-LEVODOPA ER 50-200 MG PO TBCR
1.0000 | EXTENDED_RELEASE_TABLET | Freq: Once | ORAL | Status: AC
  Administered 2019-01-09: 1 via ORAL
  Filled 2019-01-09: qty 1

## 2019-01-09 NOTE — ED Triage Notes (Signed)
Patient arrived by [facility] from [facility] home. Staff report that patient pointed at chest and they assumed patient had CP and administered 324mg  asa and 3 SL NTG. Patient states that she does not have CP and concerned with the increased tremors and leg pain, also states that her voice has changed. Alert to baseline, currently resides in a memory care unit.

## 2019-01-09 NOTE — ED Notes (Signed)
pts son, Peggye Fothergill, would like an update when possible at [phone]

## 2019-01-09 NOTE — ED Notes (Signed)
RN attemtped to call Laurena Bering to give report several times, no answer.

## 2019-01-09 NOTE — ED Provider Notes (Signed)
[facility] EMERGENCY DEPARTMENT Provider Note   CSN: [id number] Arrival date & time: 01/09/19  1543    History   Chief Complaint Chief Complaint  Patient presents with  . Increased tremors/ leg pain    HPI Nichole Krueger is a 83 y.o. female.      Chest Pain  Pain location:  Substernal area and L chest Pain quality: aching and pressure   Pain severity:  Mild Onset quality:  Gradual Duration:  1 day Timing:  Constant Progression:  Resolved Chronicity:  Recurrent Context: at rest   Context: not breathing   Relieved by:  None tried Worsened by:  Nothing Ineffective treatments:  None tried Associated symptoms: fatigue   Associated symptoms: no abdominal pain, no altered mental status, no back pain, no cough, no fever, no lower extremity edema, no near-syncope, no palpitations, no shortness of breath and no vomiting   Risk factors: prior DVT/PE     Past Medical History:  Diagnosis Date  . Anxiety   . Atrial fibrillation (HCC)   . Macular degeneration   . Parkinson's disease (HCC)   . Pulmonary embolism [facility])     Patient Active Problem List   Diagnosis Date Noted  . Impaired fasting glucose 10/15/2017  . Normocytic anemia 10/15/2017  . Pulmonary arterial hypertension (HCC) 10/15/2017  . Chest pain 10/14/2017  . Orthostatic syncope 05/06/2017  . Syncope 05/04/2017  . Muscle rigidity 01/31/2017  . AF (paroxysmal atrial fibrillation) (HCC) 01/31/2017  . Parkinson's disease (HCC) 01/31/2017    Past Surgical History:  Procedure Laterality Date  . BACK SURGERY  2017   x3  . CATARACT EXTRACTION Right   . EXTERNAL FIXATION ARM       OB History   No obstetric history on file.      Home Medications    Prior to Admission medications   Medication Sig Start Date End Date Taking? Authorizing Provider  acetaminophen (TYLENOL) 500 MG tablet Take 1,000 mg by mouth every 8 (eight) hours as needed for mild pain or moderate pain.    Yes  [provider]  ALPRAZolam (XANAX) 0.25 MG tablet Take 1 tablet (0.25 mg total) by mouth daily as needed for anxiety. Patient taking differently: Take 0.25 mg by mouth 3 (three) times daily.  10/15/17  Yes Sheikh, Omair Latif, DO  ALPRAZolam Prudy Feeler) 0.25 MG tablet Take 0.25 mg by mouth daily as needed for anxiety.   Yes [provider]  apixaban (ELIQUIS) 5 MG TABS tablet Take 5 mg by mouth 2 (two) times daily.   Yes [provider]  budesonide (PULMICORT) 0.5 MG/2ML nebulizer solution Take 2 mLs (0.5 mg total) by nebulization 2 (two) times daily. 12/08/18  Yes Mannam, Praveen, MD  calcitonin, salmon, (MIACALCIN/FORTICAL) 200 UNIT/ACT nasal spray Place 1 spray into alternate nostrils daily.   Yes [provider]  carbidopa-levodopa (SINEMET CR) 50-200 MG tablet Take 1 tablet by mouth at bedtime. Patient taking differently: Take 1 tablet by mouth every 8 (eight) hours. At 0600, 1400, 2200 08/25/17  Yes Tat, Octaviano Batty, DO  carbidopa-levodopa (SINEMET IR) 25-100 MG tablet 2 tablets at 6 am, 2 tablets at 9 am, 2 tablets at 12 pm, 2 tablets at 3 pm, 1 PRN at 5 pm Patient taking differently: Take 1 tablet by mouth daily as needed (spasma).  08/26/17  Yes Tat, Octaviano Batty, DO  carbidopa-levodopa (SINEMET IR) 25-100 MG tablet Take 1 tablet by mouth 3 (three) times daily. At 1000, 1800, 0300  Yes [provider]  formoterol (PERFOROMIST) 20 MCG/2ML nebulizer solution 64ml by nebulizer two time daily Patient taking differently: Take 20 mcg by nebulization 2 (two) times daily.  12/08/18  Yes Mannam, Praveen, MD  loratadine (CLARITIN) 10 MG tablet Take by mouth.   Yes [provider]  meloxicam (MOBIC) 15 MG tablet Take 15 mg by mouth daily.   Yes [provider]  Multiple Vitamin (MULTI-VITAMINS) TABS Take 1 tablet by mouth daily. 05/14/11  Yes [provider]  Polyethyl Glycol-Propyl Glycol (SYSTANE) 0.4-0.3 % SOLN Place 1 drop into both eyes 4  (four) times daily.   Yes [provider]  sertraline (ZOLOFT) 25 MG tablet Take 25 mg by mouth daily.   Yes [provider]  baclofen (LIORESAL) 10 MG tablet Take 1 tablet (10 mg total) by mouth 3 (three) times daily as needed for muscle spasms (leg muscle spasms). Patient not taking: Reported on 01/09/2019 02/01/17   Rolly Salter, MD  cyanocobalamin (,VITAMIN B-12,) 1000 MCG/ML injection Inject 1 mL (1,000 mcg total) into the muscle every 30 (thirty) days. Patient not taking: Reported on 01/09/2019 07/24/17   Tat, Octaviano Batty, DO  Levodopa (INBRIJA) 42 MG CAPS Place 1 capsule into inhaler and inhale as needed. Patient not taking: Reported on 01/09/2019 04/03/18   Tat, Octaviano Batty, DO  traMADol (ULTRAM) 50 MG tablet Take 1 tablet (50 mg total) by mouth every 6 (six) hours as needed for moderate pain or severe pain. Patient not taking: Reported on 01/09/2019 01/03/19   Devoria Albe, MD    Family History Family History  Problem Relation Age of Onset  . Stroke Mother   . Heart attack Father   . Heart attack Sister     Social History Social History   Tobacco Use  . Smoking status: Never Smoker  . Smokeless tobacco: Never Used  Substance Use Topics  . Alcohol use: Yes    Comment: occasional wine  . Drug use: No     Allergies   Other and Thorazine [chlorpromazine]   Review of Systems Review of Systems  Constitutional: Positive for activity change and fatigue. Negative for chills and fever.  HENT: Negative for ear pain and sore throat.   Eyes: Negative for pain and visual disturbance.  Respiratory: Negative for cough and shortness of breath.   Cardiovascular: Positive for chest pain. Negative for palpitations and near-syncope.  Gastrointestinal: Negative for abdominal pain and vomiting.  Genitourinary: Negative for dysuria and hematuria.  Musculoskeletal: Negative for arthralgias and back pain.  Skin: Negative for color change and rash.  Neurological: Negative for  seizures and syncope.  All other systems reviewed and are negative.    Physical Exam Updated Vital Signs BP (!) 167/91   Pulse 92   Temp 97.7 F (36.5 C) (Rectal)   Resp (!) 25   SpO2 95%   Physical Exam Vitals signs and nursing note reviewed.  Constitutional:      General: She is not in acute distress.    Appearance: She is well-developed.     Comments: Patient resting comfortably, on room air, GCS 15, persistent Parkinson's tremor.  HENT:     Head: Normocephalic and atraumatic.  Eyes:     Extraocular Movements: Extraocular movements intact.     Conjunctiva/sclera: Conjunctivae normal.     Pupils: Pupils are equal, round, and reactive to light.  Neck:     Musculoskeletal: Neck supple.  Cardiovascular:     Rate and Rhythm: Normal rate and regular  rhythm.     Heart sounds: No murmur.     Comments: Pulse rate 80 to 90 on palpation at bedside. Pulmonary:     Effort: Pulmonary effort is normal. No respiratory distress.     Breath sounds: Normal breath sounds. No stridor. No rhonchi.  Abdominal:     Palpations: Abdomen is soft.     Tenderness: There is no abdominal tenderness.  Musculoskeletal: Normal range of motion.  Skin:    General: Skin is warm and dry.     Capillary Refill: Capillary refill takes less than 2 seconds.  Neurological:     Mental Status: She is alert and oriented to person, place, and time. Mental status is at baseline.     Cranial Nerves: No cranial nerve deficit.     Motor: No weakness.     Comments: Baseline Parkinson's tremor, worse on the right than the left.      ED Treatments / Results  Labs (all labs ordered are listed, but only abnormal results are displayed) Labs Reviewed  COMPREHENSIVE METABOLIC PANEL - Abnormal; Notable for the following components:      Result Value   Glucose, Bld 137 (*)    Calcium 8.8 (*)    Total Protein 6.1 (*)    All other components within normal limits  CBC WITH DIFFERENTIAL/PLATELET - Abnormal; Notable  for the following components:   WBC 3.7 (*)    RBC 3.21 (*)    Hemoglobin 10.6 (*)    HCT 32.7 (*)    MCV 101.9 (*)    All other components within normal limits  BRAIN NATRIURETIC PEPTIDE - Abnormal; Notable for the following components:   B Natriuretic Peptide 271.2 (*)    All other components within normal limits  INFLUENZA PANEL BY PCR (TYPE A & B)  CK  TSH  MAGNESIUM  PHOSPHORUS  URINALYSIS, ROUTINE W REFLEX MICROSCOPIC  I-STAT TROPONIN, ED  I-STAT TROPONIN, ED  I-STAT TROPONIN, ED    EKG EKG Interpretation  Date/Time:  Saturday January 09 2019 16:35:04 EST Ventricular Rate:  93 PR Interval:    QRS Duration: 138 QT Interval:  360 QTC Calculation: 448 R Axis:   27 Text Interpretation:  SR 95 Artifact in lead(s) I III aVR aVL aVF V1 stilll extensive tremor artifact Confirmed by Arby Barrette [phone]) on 01/09/2019 8:36:35 PM   Radiology Dg Chest Portable 1 View  Result Date: 01/09/2019 CLINICAL DATA:  Chest pain EXAM: PORTABLE CHEST 1 VIEW COMPARISON:  12/07/2018 FINDINGS: Cardiomegaly. No confluent airspace opacities or effusions. No overt edema. No acute bony abnormality. Posterior spinal rods noted in the thoracic and visualized upper lumbar spine. IMPRESSION: Cardiomegaly.  No active disease. Electronically Signed   By: Charlett Nose M.D.   On: 01/09/2019 16:34    Procedures Procedures (including critical care time)  Medications Ordered in ED Medications  miconazole (MICOTIN) 2 % cream (has no administration in time range)  baclofen (LIORESAL) tablet 10 mg (10 mg Oral Refused 01/09/19 2057)  carbidopa-levodopa (SINEMET IR) 25-100 MG per tablet immediate release 1 tablet (1 tablet Oral Refused 01/09/19 2355)  sodium chloride 0.9 % bolus 1,000 mL (0 mLs Intravenous Stopped 01/09/19 1819)  carbidopa-levodopa (SINEMET CR) 50-200 MG per tablet controlled release 1 tablet (1 tablet Oral Given 01/09/19 2057)  ALPRAZolam Prudy Feeler) tablet 0.5 mg (0.5 mg Oral Given 01/09/19  2033)     Initial Impression / Assessment and Plan / ED Course  I have reviewed the triage vital signs and the nursing  notes.  Pertinent labs & imaging results that were available during my care of the patient were reviewed by me and considered in my medical decision making (see chart for details).        83 year old female significant past medical history of A. fib on Eliquis, Parkinson's, hyperlipidemia, history of PE, chronic dyspnea per pulmonology charting approximately 1 month ago indicates that the patient also has likely asthma with restrictive pattern based on recent pulmonary function testing who presents with chest pain which started today.  Patient also indicates increased fatigue, not feeling well.  She states she is also being treated for trigger finger of her left middle finger and has had injections which have helped.  She had them done last about 3 months ago.  This was done by Dr. Merlyn Lot.  Concern for possible infectious etiology for the patient's fatigue, also work-up ACS, assess for pneumonia, electrolyte imbalance other causes for the patient's fatigue, chest pain.   Physical exam indicative for clear lung sounds, no abdominal tenderness, mild yeast rash in the skin folds.  Laboratory studies indicate appropriate kidney function, appropriate electrolytes.  Negative troponin chest x-ray is not indicate pneumonia.  Patient given home parkinsonian medications.  With improved symptoms, patient at baseline.  Discussed with on-call neurology who is in agreement.  With patient returning to baseline, resolution of symptoms and review of laboratory studies will discharge back to her care facility.  Patient in agreement with this plan.  Patient discharged in stable edition with stable vital signs.  The above care was discussed and agreed upon by my attending physician.  Final Clinical Impressions(s) / ED Diagnoses   Final diagnoses:  Parkinson's disease [facility])    ED Discharge  Orders    None       Dahlia Client, MD 01/10/19 1610    Arby Barrette, MD 01/15/19 1800

## 2019-01-19 ENCOUNTER — Other Ambulatory Visit: Payer: Self-pay | Admitting: *Deleted

## 2019-01-19 DIAGNOSIS — R202 Paresthesia of skin: Principal | ICD-10-CM

## 2019-01-19 DIAGNOSIS — R200 Anesthesia of skin: Secondary | ICD-10-CM

## 2019-01-25 ENCOUNTER — Other Ambulatory Visit: Payer: Self-pay

## 2019-01-25 ENCOUNTER — Emergency Department (HOSPITAL_COMMUNITY): Payer: Medicare Other

## 2019-01-25 ENCOUNTER — Emergency Department (HOSPITAL_COMMUNITY)
Admission: EM | Admit: 2019-01-25 | Discharge: 2019-01-25 | Disposition: A | Discharge status: Home / Self Care | Payer: Medicare Other | Attending: Emergency Medicine | Admitting: Emergency Medicine

## 2019-01-25 ENCOUNTER — Encounter (HOSPITAL_COMMUNITY): Payer: Self-pay

## 2019-01-25 DIAGNOSIS — Z7901 Long term (current) use of anticoagulants: Secondary | ICD-10-CM | POA: Diagnosis not present

## 2019-01-25 DIAGNOSIS — Z86711 Personal history of pulmonary embolism: Secondary | ICD-10-CM | POA: Insufficient documentation

## 2019-01-25 DIAGNOSIS — Z79899 Other long term (current) drug therapy: Secondary | ICD-10-CM | POA: Insufficient documentation

## 2019-01-25 DIAGNOSIS — G20 Parkinson's disease: Secondary | ICD-10-CM

## 2019-01-25 DIAGNOSIS — R000 Tachycardia, unspecified: Secondary | ICD-10-CM | POA: Diagnosis present

## 2019-01-25 LAB — I-STAT TROPONIN, ED: Troponin i, poc: 0 ng/mL (ref 0.00–0.08)

## 2019-01-25 LAB — BASIC METABOLIC PANEL
Anion gap: 6 (ref 5–15)
BUN: 21 mg/dL (ref 8–23)
CO2: 25 mmol/L (ref 22–32)
CREATININE: 0.72 mg/dL (ref 0.44–1.00)
Calcium: 8.9 mg/dL (ref 8.9–10.3)
Chloride: 109 mmol/L (ref 98–111)
GFR calc Af Amer: >60 mL/min (ref >60)
GFR calc non Af Amer: >60 mL/min (ref >60)
Glucose, Bld: 107 mg/dL — ABNORMAL HIGH (ref 70–99)
Potassium: 4.3 mmol/L (ref 3.5–5.1)
SODIUM: 140 mmol/L (ref 135–145)

## 2019-01-25 LAB — CBC
HCT: 34 % — ABNORMAL LOW (ref 36.0–46.0)
Hemoglobin: 10.5 g/dL — ABNORMAL LOW (ref 12.0–15.0)
MCH: 32.5 pg (ref 26.0–34.0)
MCHC: 30.9 g/dL (ref 30.0–36.0)
MCV: 105.3 fL — ABNORMAL HIGH (ref 80.0–100.0)
Platelets: 202 10*3/uL (ref 150–400)
RBC: 3.23 MIL/uL — ABNORMAL LOW (ref 3.87–5.11)
RDW: 13.9 % (ref 11.5–15.5)
WBC: 4.5 10*3/uL (ref 4.0–10.5)
nRBC: 0 % (ref 0.0–0.2)

## 2019-01-25 LAB — PROTIME-INR
INR: 1.4 — ABNORMAL HIGH (ref 0.8–1.2)
Prothrombin Time: 16.6 seconds — ABNORMAL HIGH (ref 11.4–15.2)

## 2019-01-25 MED ORDER — SODIUM CHLORIDE 0.9% FLUSH: 3.0000 mL | Freq: Once | INTRAVENOUS | Status: DC

## 2019-01-25 NOTE — Discharge Instructions (Addendum)
The testing today does not show any serious problems.  Continue taking your usual medications.  Follow-up with your primary care doctor as needed, and in 1 week for checkup.  He may want to refer you to Dr. Arbutus Leas to see for evaluation of your Parkinson's disease.

## 2019-01-25 NOTE — ED Notes (Signed)
Report called to Kandace Parkins, LPN at [facility]. LPN notified that patient will be transported back via [facility].

## 2019-01-25 NOTE — ED Notes (Signed)
[facility] called for patient transport back to [facility]

## 2019-01-25 NOTE — ED Provider Notes (Signed)
[facility]   CSN: [id number] Arrival date & time: 01/25/19  1444    History   Chief Complaint Chief Complaint  Patient presents with  . Irregular Heart Beat    HPI Nichole Krueger is a 83 y.o. female.     HPI   Patient presents for evaluation from her nursing care facility for elevated heart rate.  She also is complaining of pain in her left hand associated with a "trigger finger."  She has pain in her left neck, which she feels is from the tremor associated with her Parkinson's disease.  She states that she would like her neurologist to admit her to get some testing done for her Parkinson's disease.  She denies cough, shortness of breath, new weakness or disability.  There are no other known modifying factors.   Past Medical History:  Diagnosis Date  . Anxiety   . Atrial fibrillation (HCC)   . Macular degeneration   . Parkinson's disease (HCC)   . Pulmonary embolism [facility](HCC)     Patient Active Problem List   Diagnosis Date Noted  . Impaired fasting glucose 10/15/2017  . Normocytic anemia 10/15/2017  . Pulmonary arterial hypertension (HCC) 10/15/2017  . Chest pain 10/14/2017  . Orthostatic syncope 05/06/2017  . Syncope 05/04/2017  . Muscle rigidity 01/31/2017  . AF (paroxysmal atrial fibrillation) (HCC) 01/31/2017  . Parkinson's disease (HCC) 01/31/2017    Past Surgical History:  Procedure Laterality Date  . BACK SURGERY  2017   x3  . CATARACT EXTRACTION Right   . EXTERNAL FIXATION ARM       OB History   No obstetric history on file.      Home Medications    Prior to Admission medications   Medication Sig Start Date End Date Taking? Authorizing Provider  acetaminophen (TYLENOL) 500 MG tablet Take 1,000 mg by mouth every 8 (eight) hours as needed for mild pain or moderate pain.     [provider]  ALPRAZolam Prudy Feeler(XANAX) 0.25 MG tablet Take 1 tablet (0.25 mg total) by mouth daily as needed for  anxiety. Patient taking differently: Take 0.25 mg by mouth 3 (three) times daily.  10/15/17   Sheikh, Omair Latif, DO  ALPRAZolam Prudy Feeler(XANAX) 0.25 MG tablet Take 0.25 mg by mouth daily as needed for anxiety.    [provider]  apixaban (ELIQUIS) 5 MG TABS tablet Take 5 mg by mouth 2 (two) times daily.    [provider]  baclofen (LIORESAL) 10 MG tablet Take 1 tablet (10 mg total) by mouth 3 (three) times daily as needed for muscle spasms (leg muscle spasms). Patient not taking: Reported on 01/09/2019 02/01/17   Rolly SalterPatel, Pranav M, MD  budesonide (PULMICORT) 0.5 MG/2ML nebulizer solution Take 2 mLs (0.5 mg total) by nebulization 2 (two) times daily. 12/08/18   Mannam, Colbert CoyerPraveen, MD  calcitonin, salmon, (MIACALCIN/FORTICAL) 200 UNIT/ACT nasal spray Place 1 spray into alternate nostrils daily.    [provider]  carbidopa-levodopa (SINEMET CR) 50-200 MG tablet Take 1 tablet by mouth at bedtime. Patient taking differently: Take 1 tablet by mouth every 8 (eight) hours. At 0600, 1400, 2200 08/25/17   Tat, Rebecca S, DO  carbidopa-levodopa (SINEMET IR) 25-100 MG tablet 2 tablets at 6 am, 2 tablets at 9 am, 2 tablets at 12 pm, 2 tablets at 3 pm, 1 PRN at 5 pm Patient taking differently: Take 1 tablet by mouth daily as needed (spasma).  08/26/17   TatOctaviano Batty, Rebecca S, DO  carbidopa-levodopa (SINEMET IR) 25-100 MG tablet Take 1 tablet by mouth 3 (three) times daily. At 1000, 1800, 0300    [provider]  cyanocobalamin (,VITAMIN B-12,) 1000 MCG/ML injection Inject 1 mL (1,000 mcg total) into the muscle every 30 (thirty) days. Patient not taking: Reported on 01/09/2019 07/24/17   Tat, Octaviano Batty, DO  formoterol (PERFOROMIST) 20 MCG/2ML nebulizer solution 2ml by nebulizer two time daily Patient taking differently: Take 20 mcg by nebulization 2 (two) times daily.  12/08/18   Mannam, Colbert Coyer, MD  Levodopa (INBRIJA) 42 MG CAPS Place 1 capsule into inhaler and inhale as needed. Patient not taking:  Reported on 01/09/2019 04/03/18   Tat, Octaviano Batty, DO  loratadine (CLARITIN) 10 MG tablet Take by mouth.    [provider]  meloxicam (MOBIC) 15 MG tablet Take 15 mg by mouth daily.    [provider]  Multiple Vitamin (MULTI-VITAMINS) TABS Take 1 tablet by mouth daily. 05/14/11   [provider]  Polyethyl Glycol-Propyl Glycol (SYSTANE) 0.4-0.3 % SOLN Place 1 drop into both eyes 4 (four) times daily.    [provider]  sertraline (ZOLOFT) 25 MG tablet Take 25 mg by mouth daily.    [provider]  traMADol (ULTRAM) 50 MG tablet Take 1 tablet (50 mg total) by mouth every 6 (six) hours as needed for moderate pain or severe pain. Patient not taking: Reported on 01/09/2019 01/03/19   Devoria Albe, MD    Family History Family History  Problem Relation Age of Onset  . Stroke Mother   . Heart attack Father   . Heart attack Sister     Social History Social History   Tobacco Use  . Smoking status: Never Smoker  . Smokeless tobacco: Never Used  Substance Use Topics  . Alcohol use: Yes    Comment: occasional wine  . Drug use: No     Allergies   Other and Thorazine [chlorpromazine]   Review of Systems Review of Systems  All other systems reviewed and are negative.    Physical Exam Updated Vital Signs BP (!) 141/82   Pulse 77   Temp (!) 97.5 F (36.4 C) (Oral)   Resp 14   SpO2 96%   Physical Exam Vitals signs and nursing note reviewed.  Constitutional:      General: She is not in acute distress.    Appearance: She is well-developed. She is not ill-appearing or diaphoretic.  HENT:     Head: Normocephalic and atraumatic.     Right Ear: External ear normal.     Left Ear: External ear normal.     Mouth/Throat:     Mouth: Mucous membranes are moist.     Pharynx: No oropharyngeal exudate or posterior oropharyngeal erythema.  Eyes:     Conjunctiva/sclera: Conjunctivae normal.     Pupils: Pupils are equal, round, and reactive to  light.  Neck:     Musculoskeletal: Normal range of motion and neck supple.     Trachea: Phonation normal.  Cardiovascular:     Rate and Rhythm: Normal rate and regular rhythm.     Heart sounds: Normal heart sounds. No murmur.  Pulmonary:     Effort: Pulmonary effort is normal.     Breath sounds: Normal breath sounds.  Abdominal:     Palpations: Abdomen is soft.     Tenderness: There is no abdominal tenderness.  Musculoskeletal: Normal range of motion.  Skin:    General: Skin is warm.  Comments: Diaphoretic  Neurological:     Mental Status: She is alert and oriented to person, place, and time.     Cranial Nerves: No cranial nerve deficit.     Sensory: No sensory deficit.     Motor: No abnormal muscle tone.     Coordination: Coordination normal.  Psychiatric:        Mood and Affect: Mood normal.        Behavior: Behavior normal.      ED Treatments / Results  Labs (all labs ordered are listed, but only abnormal results are displayed) Labs Reviewed  BASIC METABOLIC PANEL - Abnormal; Notable for the following components:      Result Value   Glucose, Bld 107 (*)    All other components within normal limits  CBC - Abnormal; Notable for the following components:   RBC 3.23 (*)    Hemoglobin 10.5 (*)    HCT 34.0 (*)    MCV 105.3 (*)    All other components within normal limits  PROTIME-INR - Abnormal; Notable for the following components:   Prothrombin Time 16.6 (*)    INR 1.4 (*)    All other components within normal limits  I-STAT TROPONIN, ED    EKG EKG Interpretation  Date/Time:  Monday January 25 2019 15:27:45 EDT Ventricular Rate:  95 PR Interval:    QRS Duration: 74 QT Interval:  332 QTC Calculation: 418 R Axis:   -4 Text Interpretation:  Sinus rhythm Short PR interval Anteroseptal infarct, old since last tracing no significant change Confirmed by Mancel Bale [phone]) on 01/25/2019 4:10:05 PM   Radiology Dg Chest 2 View  Result Date:  01/25/2019 CLINICAL DATA:  Tachycardia. EXAM: CHEST - 2 VIEW COMPARISON:  01/09/2019 FINDINGS: Enlarged cardiac silhouette. Calcific atherosclerotic disease of the aorta. Mediastinal contours appear intact. There is no evidence of pneumothorax. Mild pulmonary vascular congestion. Bilateral small pleural effusions. Osseous structures are without acute abnormality. Soft tissues are grossly normal. IMPRESSION: 1. Enlarged cardiac silhouette. 2. Mild pulmonary vascular congestion. 3. Bilateral small pleural effusions. Electronically Signed   By: Ted Mcalpine M.D.   On: 01/25/2019 16:35    Procedures Procedures (including critical care time)  Medications Ordered in ED Medications  sodium chloride flush (NS) 0.9 % injection 3 mL (has no administration in time range)     Initial Impression / Assessment and Plan / ED Course  I have reviewed the triage vital signs and the nursing notes.  Pertinent labs & imaging results that were available during my care of the patient were reviewed by me and considered in my medical decision making (see chart for details).  Clinical Course as of Jan 25 1807  Sheral Flow Jan 25, 2019  1633 The patient complains of diaphoresis.  Currently cardiac monitor reading heart rate to 240, but pulse is 96 palpated at the right wrist.  Verification on main cardiac monitor shows lead V3 with heart rate less than 100.  Most of the rest of the cardiac leads have significant artifact, which are likely causing the cardiac monitor to read very elevated heart rate.   [EW]  1657 Normal  I-Stat Troponin, ED (not at [facility]) [EW]  1657 Normal  Basic metabolic panel(!) [EW]  1657 Subtherapeutic  Protime-INR- (order if Patient is taking Coumadin / Warfarin)(!) [EW]  1657 Normal except hemoglobin low, MCV high  CBC(!) [EW]  1700 At this time cardiac monitor utilizing lead V3 shows heart rate 96.   [EW]    Clinical Course  User Index [EW] Mancel Bale, MD        Patient Vitals for  the past 24 hrs:  BP Temp Temp src Pulse Resp SpO2  01/25/19 1730 (!) 141/82 - - 77 14 96 %  01/25/19 1700 (!) 150/78 - - 81 11 94 %  01/25/19 1630 (!) 155/108 - - 95 (!) 9 98 %  01/25/19 1530 (!) 158/98 - - 94 17 94 %  01/25/19 1516 (!) 170/122 (!) 97.5 F (36.4 C) Oral 93 20 98 %    6:06 PM Reevaluation with update and discussion. After initial assessment and treatment, an updated evaluation reveals no change in clinical status.  Final discussed with patient all questions answered. Mancel Bale   Medical Decision Making: Nonspecific symptoms without worrisome findings.  No indication for further evaluation or intervention at this time.  Patient appears to have stable chronic illnesses.  Serious bacterial infection, metabolic instability, ACS, cardiac arrhythmia or PE.  CRITICAL CARE-no Performed by: Mancel Bale   Nursing Notes Reviewed/ Care Coordinated Applicable Imaging Reviewed Interpretation of Laboratory Data incorporated into ED treatment  The patient appears reasonably screened and/or stabilized for discharge and I doubt any other medical condition or other [facility] requiring further screening, evaluation, or treatment in the ED at this time prior to discharge.  Plan: Home Medications-usual; Home Treatments-rest, fluids; return here if the recommended treatment, does not improve the symptoms; Recommended follow up- PCP prn      Final Clinical Impressions(s) / ED Diagnoses   Final diagnoses:  Parkinson's disease [facility]    ED Discharge Orders    None       Mancel Bale, MD 01/25/19 [phone]

## 2019-01-25 NOTE — ED Triage Notes (Addendum)
Patient BIB EMS From [facility]. Staff reported patient heart rate was elevated at 127. Patient has history of dementia and is alert to self and DOB only, which is her baseline. Patient afebrile for EMS but slightly diaphoretic. CBG for EMS = 164. Patient denies chest pain/SOB. Patient lung sounds clear for EMS. 12 lead EKG for EMS Normal ST. 18G Left FA PIV placed by EMS.   Patient has out of hospital DNR- golden ticket in patient room folder file.

## 2019-02-02 ENCOUNTER — Other Ambulatory Visit: Payer: Self-pay

## 2019-02-02 ENCOUNTER — Ambulatory Visit (INDEPENDENT_AMBULATORY_CARE_PROVIDER_SITE_OTHER): Payer: Medicare Other | Admitting: Neurology

## 2019-02-02 DIAGNOSIS — R202 Paresthesia of skin: Secondary | ICD-10-CM

## 2019-02-02 DIAGNOSIS — G5603 Carpal tunnel syndrome, bilateral upper limbs: Secondary | ICD-10-CM

## 2019-02-02 DIAGNOSIS — R200 Anesthesia of skin: Secondary | ICD-10-CM

## 2019-02-02 NOTE — Procedures (Signed)
[facility]  [address] [place], [address]  [place], [place] [address] Tel: [phone] Fax:  [phone] Test Date:  02/02/2019  Patient: Nichole Krueger DOB: [birth year] Physician: Nita Sickle, DO  Sex: Female Height: 5\' 4"  Ref Phys: Cindee Salt, MD  ID#: [id number] Temp: 35.0C Technician:    Patient Complaints: This is a 83 year old female with Parkinson's disease and atrial fibrillation on anticoagulation therapy referred for evaluation of the left hand weakness, numbness, and tingling.  NCV & EMG Findings: Extensive electrodiagnostic testing of the left upper extremity and additional studies of the right shows:  1. Median sensory response is absent on the left and shows prolonged latency (4.0 ms) and reduced amplitude (9.3 V) on the right.  Bilateral ulnar sensory responses are within normal limits. 2. Median motor response is absent on the left and shows reduced amplitude on the right (2.7 mV).  Bilateral ulnar motor responses are within normal limits.   3. Despite maximal activation, no motor unit recruitment is seen in the left abductor pollicis brevis muscle.  Chronic motor axonal loss changes are present in the right abductor pollicis brevis muscle.  There is no evidence of fibrillation potentials.    Impression: Bilateral median neuropathy at or distal to the wrist, consistent with a clinical diagnosis of carpal tunnel syndrome, which is very severe on the left and severe on the right.   ___________________________ Nita Sickle, DO    Nerve Conduction Studies Anti Sensory Summary Table   Site NR Peak (ms) Norm Peak (ms) P-T Amp (V) Norm P-T Amp  Left Median Anti Sensory (2nd Digit)  35C  Wrist NR  <3.8  >10  Right Median Anti Sensory (2nd Digit)  35C  Wrist    4.0 <3.8 9.3 >10  Left Ulnar Anti Sensory (5th Digit)  35C  Wrist    2.6 <3.2 13.4 >5  Right Ulnar Anti Sensory (5th Digit)  35C  Wrist    2.5 <3.2 19.8 >5   Motor Summary Table   Site NR  Onset (ms) Norm Onset (ms) O-P Amp (mV) Norm O-P Amp Site1 Site2 Delta-0 (ms) Dist (cm) Vel (m/s) Norm Vel (m/s)  Left Median Motor (Abd Poll Brev)  35C  Wrist NR  <4.0  >5 Elbow Wrist  0.0  >50  Elbow NR            Right Median Motor (Abd Poll Brev)  35C  Wrist    4.0 <4.0 2.7 >5 Elbow Wrist 4.5 26.0 58 >50  Elbow    8.5  2.5         Left Ulnar Motor (Abd Dig Minimi)  35C  Wrist    2.6 <3.1 9.0 >7 B Elbow Wrist 3.4 21.0 62 >50  B Elbow    6.0  8.6  A Elbow B Elbow 1.7 10.0 59 >50  A Elbow    7.7  8.6         Right Ulnar Motor (Abd Dig Minimi)  35C  Wrist    2.2 <3.1 9.3 >7 B Elbow Wrist 3.5 20.0 57 >50  B Elbow    5.7  9.1  A Elbow B Elbow 1.7 10.0 59 >50  A Elbow    7.4  8.7          EMG   Side Muscle Ins Act Fibs Psw Fasc Number Recrt Dur Dur. Amp Amp. Poly Poly. Comment  Right 1stDorInt Nml Nml Nml Nml Nml Nml Nml Nml Nml Nml Nml Nml N/A  Right Abd Poll Brev Nml Nml Nml Nml 1- Rapid Some 1+ Some 1+ Nml Nml ATR  Right PronatorTeres Nml Nml Nml Nml Nml Nml Nml Nml Nml Nml Nml Nml N/A  Right Biceps Nml Nml Nml Nml Nml Nml Nml Nml Nml Nml Nml Nml N/A  Right Triceps Nml Nml Nml Nml Nml Nml Nml Nml Nml Nml Nml Nml N/A  Right Deltoid Nml Nml Nml Nml Nml Nml Nml Nml Nml Nml Nml Nml N/A  Left 1stDorInt Nml Nml Nml Nml Nml Nml Nml Nml Nml Nml Nml Nml N/A  Left Abd Poll Brev Nml Nml Nml Nml NE None - - - - - - ATR  Left PronatorTeres Nml Nml Nml Nml Nml Nml Nml Nml Nml Nml Nml Nml N/A  Left Biceps Nml Nml Nml Nml Nml Nml Nml Nml Nml Nml Nml Nml N/A  Left Triceps Nml Nml Nml Nml Nml Nml Nml Nml Nml Nml Nml Nml N/A  Left Deltoid Nml Nml Nml Nml Nml Nml Nml Nml Nml Nml Nml Nml N/A      Waveforms:

## 2019-02-04 ENCOUNTER — Telehealth: Payer: Self-pay | Admitting: Neurology

## 2019-02-04 NOTE — Progress Notes (Deleted)
Nichole Krueger was seen today in the movement disorders clinic for neurologic consultation at the request of [name], MD.  The consultation is for the evaluation of PD.  The records that were made available to me were reviewed.  Pt currently under the care of [name].  Last seen by [facility] in May, 2018.  Patient was diagnosed with Parkinson's disease in 1998.  Her first symptom was R hand tremor.  She cannot remember what her first medication was.  She states that she really did well until about 5 years ago  When she was last seen at [facility], her medication was slightly changed.  She was told to take her carbidopa/levodopa 25/100, 2 pills at 6 AM/9 AM/noon/3 PM and one pill at 6 PM and 9 PM.  This was a one pill increase but it doesn't appear that this was done and she is still on carbidopa/levodopa 25/100, 2 pills at 6 AM/9 AM/noon/3 PM and one pill at 5 PM prn.  She is also on carbidopa/levodopa 50/200 at bedtime.  She was told to take pramipexole 0.25 mg 3 times per day and then 2 tablets at bedtime; again, this does not appear to have been transcribed to the nursing facility and she is now on pramipexole 0.25 mg 3 times per day and 1 mg at bedtime.    She was to maintain selegiline 5 mg, one tablet twice per day and Aricept 5 mg nightly.  She is no longer on aricept when she returns today.  She is not completely happy with current regimen and thinks that has more dyskinesia after 9am/3pm dosage.     Specific Symptoms:  Tremor: Yes.  , occasionally (and now it can be in both hands but doesn't occur regulary) Family hx of similar:  No. (grandmother had some tremor) Voice: softer especially when tired Sleep: some trouble with sleep (may fall asleep in front of TV)  Vivid Dreams:  No.  Acting out dreams:  No. Wet Pillows: Yes.   Postural symptoms:  Yes.    Falls?  Yes.  , last one 2-3 months ago (was more of a passing out than a fall.  Doesn't recall much about event).  Has  not had other falls Bradykinesia symptoms: difficulty getting out of a chair and difficulty regaining balance Loss of smell:  Yes.   Loss of taste:  No. Urinary Incontinence:  No. Difficulty Swallowing:  Yes.   (some trouble with pills but does better with taking with bananas/applesauce) Handwriting, micrographia: Yes.   (when she wrote here it didn't reflect this but reports that it often is little) Trouble with ADL's:  Yes.   (cannot tie shoes but otherwise does dress self)  Trouble buttoning clothing: Yes.   Depression:  No. Memory changes:  Yes.  , trouble with names Hallucinations:  Yes.  , in the past she had vivid hallucination and called police.  This was few years ago and hasn't had since (this was in 2015)  visual distortions: Yes.   N/V:  No. Lightheaded:  Yes.   (occasionally)  Syncope: Yes.   (in early June she had passing out episode and fell and hit back.  Now seeing spine for LBP) Diplopia:  No. Dyskinesia:  Yes.   (has dyskinesia during "on" periods and dystonia in "off" periods).  Pt reports that dystonia manifests as inversion of ankles bilaterally and then the "cramping" will progress up the body.  She states that she uses baclofen now  for that 10 mg tid.  Neuroimaging of the brain has  previously been performed.  It is available for my review today.  CT brain done in 05/16/17 and it was unremarkable for acute process.  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  I just slightly decreased her dosages of levodopa last visit because of dyskinesia which was bothersome to her.  She ultimately did not like that change and increased her medication back to carbidopa/levodopa 25/100, to 2 pills at 6 AM/9 AM/noon/3 PM and 1 at 5 PM if she needs that.  She is also on pramipexole, 0.25 mg 3 times per day and then takes 1 mg at night.  She has been to the emergency room numerous times since last visit.  She went in early November with a discomfort and  stiffness in her abdomen.  She felt that she was having shortness of breath.  The workup in the emergency room was negative.  She went to the emergency room at the end of November with right-sided facial droop.  This apparently resolved while EMS was present and had resolved by the time she got to the hospital.  MRI of the brain was attempted, but the patient could not tolerate it.  The neuro hospitalist saw the patient and felt his suspicion for stroke was low.  He felt that her symptoms could be due to missing her levodopa earlier that day.  She called me 2 days after hospital discharge to state that she was having blackout spells.  She reported that prior to hospital admission, she was in her living room and the next thing that she knew is that people were around her trying to get her open her eyes.  She thinks that she passed out before she had the facial droop that led to the hospitalization.  Ambulatory EEG was scheduled with my office.  She was agreeable, but then canceled it.  She went back to the hospital on October 31, 2017, after being found by a security guard at her facility.  There are no other records surrounding this.  The patient was examined by the emergency room, felt that she had missed several doses of levodopa and discharge from the emergency room was recommended.  Just before discharge, she complained that she was having difficulty swallowing.  A bedside swallowing evaluation was completed, which she did not pass and a dysphagia 3 (mechanical soft) diet was recommended.  Interestingly, the patient also complained of left-sided facial tightness during this ER stay.  She was ultimately discharged back to [facility], but to the skilled nursing facility portion instead of independent living.  She states that she is back in independent living.  She is still doing therapy.  She thinks that the therapy helps.  She is having hallucinations and that "is a real problem."  She feels that she is seeing  her son which is now deceased.  She does know it is not real.  She, however, feels that the "hallucinations are drawing me into their world."  She thinks that moods are good but admits to anxiety.   04/03/18 update: Patient is seen today in follow-up.  She is accompanied by a caregiver from [facility] who supplements the history.  I weaned her off of pramipexole since our last visit, primarily because of hallucinations.  She is still on carbidopa/levodopa 25/100, 2 pills at 6 AM/9 AM/noon/3 PM and an additional at 5 PM if needed.  She instead of taking the  1 tablet of 5pm, she takes 1/2 tablet at 6pm and 1/2 at 2:30am.  She is also on carbidopa/levodopa 50/200 at bed.   I have received numerous phone calls from her and [facility] since our last visit.  Records have been reviewed.  She was in the emergency room on December 06, 2017 after a fall.  CT was nonacute and she was discharged from the emergency room.  I got a call in February [facility] about episodes of tightness in the patient's diaphragm associated with shortness of breath (and have subsequently gotten several calls about this).  She had previously discussed with me similar episodes, as documented in other notes.  I did not think that these are related to Parkinson's disease, and had previously offered ambulatory EEG but she canceled it.  She did end up having that done on February 28 and it was normal.  This was done after [facility] and said that she has had episodes of mental status change, even with sternal rub.  I did recommend cardiology follow-up.  She was previously seen for paroxysmal atrial fibrillation by cardiology.  Told her that I saw no reason for her to have these from a neurologic standpoint, if vitals were good, which they were per the facility.  Patient was back in the emergency room on Mar 29, 2018 for spasms in her entire body.  She has been there previously for that.  Work-up was negative, including cardiac work-up.   She does have freezing of gait but only 2-3 times total since last visit.  Some occasional trouble with "morning on."  08/14/18 update: Patient is seen today in follow-up for Parkinson's disease.  She is on carbidopa/levodopa 25/100, 2 tablets at 6 AM/9 AM/noon/3 PM and another half tablet at 6 PM and a second half tablet in the middle of the night.  She does state that medication wears off in the evening and think that she needs more than 1/2 tablet.  She also takes carbidopa/levodopa 50/200 at bedtime.  She is also on selegeline 5 mg bid.  She admits to hallucinations but they may not be that.  States that she has them "while sleeping."  She cannot clearly tell me if they are part of dream or part of wake state.  "they seem to have a religious connotation."  She does state that she doesn't have them during the daytime.   She was given and approved for Inbrija last visit, but [facility] has a policy that the patient needed to have the medication in a lock box and, because of the Parkinson's disease, she is just unable to open a lock box.  She can have them storage, but by the time she calls them and they come, the freezing event is over.  She did call me in July to state that she had had an episode where her "memory was completely gone."  She called the police but did not remember doing it.  She had just been diagnosed with pneumonia as well.  I thought that the symptom was likely related to that as opposed to Parkinson's itself.  02/05/19 update: Patient is seen today in follow-up for Parkinson's disease.  Patient is on carbidopa/levodopa 25/100, 2 tablets at 6 AM/9 AM/noon/3 PM and 1 tablet at 6 PM and a half tablet in the middle of the night.  She is on carbidopa/levodopa 50/200 at bedtime.  I stopped her selegiline last visit.  She called me since last visit and left a message that  made it sounds like she was suicidal.  We tried to call back many times and no one answered, and eventually we had to contact  911.  Patient ultimately called [place] back to state that she was not suicidal, but that she was in a lot of pain from a back fracture and was treated by orthopedics and was just not sure if she should take her pain medication.  She was told to follow-up with the managing physicians.  She did recently have a nerve conduction study here (not ordered by me) which demonstrated severe carpal tunnel bilaterally.  She was in the emergency room on February 16 with complaints of right hand pain.  She was in the emergency room on January 09, 2019 with complaints of chest pain and work-up was negative.  She was in the emergency room on March 9 with complaints about palpitations and again hand pain.  She was discharged from the emergency room.  PREVIOUS MEDICATIONS: Sinemet and MirapexRytary (hallucinations); amantadine (hallucinations); stalevo (too expensive);   ALLERGIES:   Allergies  Allergen Reactions   Other Other (See Comments)    ANESTHESIA-HALLUCINATIONS   Thorazine [Chlorpromazine] Other (See Comments)    Unknown    CURRENT MEDICATIONS:  Outpatient Encounter Medications as of 02/05/2019  Medication Sig   acetaminophen (TYLENOL) 500 MG tablet Take 1,000 mg by mouth every 8 (eight) hours as needed for mild pain or moderate pain.    ALPRAZolam (XANAX) 0.25 MG tablet Take 1 tablet (0.25 mg total) by mouth daily as needed for anxiety. (Patient taking differently: Take 0.25 mg by mouth 3 (three) times daily. )   ALPRAZolam (XANAX) 0.25 MG tablet Take 0.25 mg by mouth daily as needed for anxiety.   apixaban (ELIQUIS) 5 MG TABS tablet Take 5 mg by mouth 2 (two) times daily.   baclofen (LIORESAL) 10 MG tablet Take 1 tablet (10 mg total) by mouth 3 (three) times daily as needed for muscle spasms (leg muscle spasms). (Patient not taking: Reported on 01/09/2019)   budesonide (PULMICORT) 0.5 MG/2ML nebulizer solution Take 2 mLs (0.5 mg total) by nebulization 2 (two) times daily.   calcitonin, salmon,  (MIACALCIN/FORTICAL) 200 UNIT/ACT nasal spray Place 1 spray into alternate nostrils daily.   carbidopa-levodopa (SINEMET CR) 50-200 MG tablet Take 1 tablet by mouth at bedtime. (Patient taking differently: Take 1 tablet by mouth every 8 (eight) hours. At 0600, 1400, 2200)   carbidopa-levodopa (SINEMET IR) 25-100 MG tablet 2 tablets at 6 am, 2 tablets at 9 am, 2 tablets at 12 pm, 2 tablets at 3 pm, 1 PRN at 5 pm (Patient taking differently: Take 1 tablet by mouth daily as needed (spasma). )   carbidopa-levodopa (SINEMET IR) 25-100 MG tablet Take 1 tablet by mouth 3 (three) times daily. At 1000, 1800, 0300   cyanocobalamin (,VITAMIN B-12,) 1000 MCG/ML injection Inject 1 mL (1,000 mcg total) into the muscle every 30 (thirty) days. (Patient not taking: Reported on 01/09/2019)   formoterol (PERFOROMIST) 20 MCG/2ML nebulizer solution 62ml by nebulizer two time daily (Patient taking differently: Take 20 mcg by nebulization 2 (two) times daily. )   Levodopa (INBRIJA) 42 MG CAPS Place 1 capsule into inhaler and inhale as needed. (Patient not taking: Reported on 01/09/2019)   loratadine (CLARITIN) 10 MG tablet Take by mouth.   meloxicam (MOBIC) 15 MG tablet Take 15 mg by mouth daily.   Multiple Vitamin (MULTI-VITAMINS) TABS Take 1 tablet by mouth daily.   Polyethyl Glycol-Propyl Glycol (SYSTANE) 0.4-0.3 % SOLN Place  1 drop into both eyes 4 (four) times daily.   sertraline (ZOLOFT) 25 MG tablet Take 25 mg by mouth daily.   traMADol (ULTRAM) 50 MG tablet Take 1 tablet (50 mg total) by mouth every 6 (six) hours as needed for moderate pain or severe pain. (Patient not taking: Reported on 01/09/2019)   No facility-administered encounter medications on file as of 02/05/2019.     PAST MEDICAL HISTORY:   Past Medical History:  Diagnosis Date   Anxiety    Atrial fibrillation (HCC)    Macular degeneration    Parkinson's disease (HCC)    Pulmonary embolism (HCC)     PAST SURGICAL HISTORY:   Past  Surgical History:  Procedure Laterality Date   BACK SURGERY  2017   x3   CATARACT EXTRACTION Right    EXTERNAL FIXATION ARM      SOCIAL HISTORY:   Social History   Socioeconomic History   Marital status: Widowed    Spouse name: Not on file   Number of children: Not on file   Years of education: Not on file   Highest education level: Not on file  Occupational History   Not on file  Social Needs   Financial resource strain: Not on file   Food insecurity:    Worry: Not on file    Inability: Not on file   Transportation needs:    Medical: Not on file    Non-medical: Not on file  Tobacco Use   Smoking status: Never Smoker   Smokeless tobacco: Never Used  Substance and Sexual Activity   Alcohol use: Yes    Comment: occasional wine   Drug use: No   Sexual activity: Not on file  Lifestyle   Physical activity:    Days per week: Not on file    Minutes per session: Not on file   Stress: Not on file  Relationships   Social connections:    Talks on phone: Not on file    Gets together: Not on file    Attends religious service: Not on file    Active member of club or organization: Not on file    Attends meetings of clubs or organizations: Not on file    Relationship status: Not on file   Intimate partner violence:    Fear of current or ex partner: Not on file    Emotionally abused: Not on file    Physically abused: Not on file    Forced sexual activity: Not on file  Other Topics Concern   Not on file  Social History Narrative   Not on file    FAMILY HISTORY:   Family Status  Relation Name Status   Mother  Deceased   Father  Deceased   Sister  (Not Specified)   Son x2 Alive    ROS:  ROS   PHYSICAL EXAMINATION:    VITALS:   There were no vitals filed for this visit.  GEN:  The patient appears stated age and is in NAD. HEENT:  Normocephalic, atraumatic.  The mucous membranes are moist. The superficial temporal arteries are without  ropiness or tenderness. CV:  RRR Lungs:  CTAB Neck/HEME:  There are no carotid bruits bilaterally.  Neurological examination:  Orientation:  Montreal Cognitive Assessment  07/24/2017  Visuospatial/ Executive (0/5) 5  Naming (0/3) 2  Attention: Read list of digits (0/2) 2  Attention: Read list of letters (0/1) 1  Attention: Serial 7 subtraction starting at 100 (0/3) 0  Language:  Repeat phrase (0/2) 2  Language : Fluency (0/1) 1  Abstraction (0/2) 2  Delayed Recall (0/5) 2  Orientation (0/6) 6  Total 23  Adjusted Score (based on education) 23   Orientation: The patient is alert and oriented x3. Cranial nerves: There is good facial symmetry. The speech is fluent and clear. Soft palate rises symmetrically and there is no tongue deviation. Hearing is intact to conversational tone. Sensation: Sensation is intact to light touch throughout Motor: Strength is 5/5 in the bilateral upper and lower extremities.   Shoulder shrug is equal and symmetric.  There is no pronator drift.  Movement examination: Tone: There is normal tone in the bilateral upper extremities.  The tone in the lower extremities is normal.  Abnormal movements: There is mod axial dyskinesia and bilateral UE and LLE Coordination:  There is good RAMs with any form of RAMS, including alternating supination and pronation of the forearm, hand opening and closing (mild trouble with this only due to arthritis), finger taps, heel taps and toe taps.. Gait and Station: she was given a routine walker and had trouble with that.  She did better with the U step walker.  She is wide based and ataxic.  Labs:  Her B12 on 04/02/2017 at [facility] was 290    ASSESSMENT/PLAN:  1.  Idiopathic Parkinson's disease.  The patient was dx in 1998.    -while patient has done incredibly well, she is bothered by on dyskinesia and off dystonia.  Amantadine has caused hallucinations in the past.  Marilynne Drivers has talked to her extensively about DBS therapy as  well as duopa.  I've addressed as well She doesn't feel that she could handle mentally DBS and doesn't think that she could physically handle the duopa tube.  She has been previously given literature on both of these topics.  We talked about the pump patch system in trial.    -unable to take inbrija due to requirement from facility that this needs to be in a lockbox and she cannot open the lockbox.  She didn't think that the samples were helpful either.  -She will continue carbidopa/levodopa 25/100, 2 tablets at 6 AM/9 AM/noon/3 PM and will increase 6 pm dosage to 1 full tablet.  Takes 1/2 tablet at 3am.    -She will continue carbidopa/levodopa 50/200 at bedtime.   -We discussed that it used to be thought that levodopa would increase risk of melanoma but now it is believed that Parkinsons itself likely increases risk of melanoma. she is to get regular skin checks.  -I'm not so sure that the "hallucinations" she describes are true hallucinations or if they are a part of vivid dreams.  Will stop selegeline.  Pt agreeable  -talked about U step and she wants to hold on that for now  -RX for PT at [facility]  2.  B12 deficiency  -on b12 injections  3.  Facial droop  -The records that were made available to me were reviewed and thus far, work up for stroke negative and when she has this she has missed her carbidopa/levodopa.  I wonder if this represents off dystonia.  The neurohospitalist reported low suspicion for stroke  4.  Abdominal pain/spasms and diffuse, full body muscle spasm.  -Explained to her that I do not think that these are from Parkinson's disease.  Explained that while Parkinson's disease can cause cramping (often times in the feet and leg at night), it is otherwise not a painful disease and does not cause diffuse  full body muscle spasms.  I told her that she would need to explore this etiology/solution with a different physician.  She will start with her primary care physician.  5.   ***Bilateral carpal tunnel, overall severe  -She is seeing Dr. Merlyn Lot and has been to the emergency room several times with complaints of right hand pain   Cc:  [name], MD

## 2019-02-04 NOTE — Telephone Encounter (Signed)
-----   Message from Nichole Batty Tat, DO sent at 02/04/2019  3:51 PM EDT ----- Please call patient.  Don't recommend she comes in tomorrow given age/medical problems.

## 2019-02-04 NOTE — Telephone Encounter (Signed)
Tried to call with no answer  

## 2019-02-05 ENCOUNTER — Ambulatory Visit: Payer: Medicare Other | Admitting: Neurology

## 2019-02-23 ENCOUNTER — Encounter: Payer: Self-pay | Admitting: Neurology

## 2019-02-24 ENCOUNTER — Encounter: Payer: Self-pay | Admitting: Pulmonary Disease

## 2019-03-09 ENCOUNTER — Ambulatory Visit: Payer: Medicare Other | Admitting: Pulmonary Disease

## 2019-03-18 ENCOUNTER — Ambulatory Visit: Payer: Medicare Other | Admitting: Pulmonary Disease

## 2019-05-19 DEATH — deceased

## 2019-07-22 NOTE — Progress Notes (Deleted)
Nichole Krueger was seen today in the movement disorders clinic for neurologic consultation at the request of [name], MD.  The consultation is for the evaluation of PD.  The records that were made available to me were reviewed.  Pt currently under the care of [name].  Last seen by [facility] in May, 2018.  Patient was diagnosed with Parkinson's disease in 1998.  Her first symptom was R hand tremor.  She cannot remember what her first medication was.  She states that she really did well until about 5 years ago  When she was last seen at [facility], her medication was slightly changed.  She was told to take her carbidopa/levodopa 25/100, 2 pills at 6 AM/9 AM/noon/3 PM and one pill at 6 PM and 9 PM.  This was a one pill increase but it doesn't appear that this was done and she is still on carbidopa/levodopa 25/100, 2 pills at 6 AM/9 AM/noon/3 PM and one pill at 5 PM prn.  She is also on carbidopa/levodopa 50/200 at bedtime.  She was told to take pramipexole 0.25 mg 3 times per day and then 2 tablets at bedtime; again, this does not appear to have been transcribed to the nursing facility and she is now on pramipexole 0.25 mg 3 times per day and 1 mg at bedtime.    She was to maintain selegiline 5 mg, one tablet twice per day and Aricept 5 mg nightly.  She is no longer on aricept when she returns today.  She is not completely happy with current regimen and thinks that has more dyskinesia after 9am/3pm dosage.     Specific Symptoms:  Tremor: Yes.  , occasionally (and now it can be in both hands but doesn't occur regulary) Family hx of similar:  No. (grandmother had some tremor) Voice: softer especially when tired Sleep: some trouble with sleep (may fall asleep in front of TV)  Vivid Dreams:  No.  Acting out dreams:  No. Wet Pillows: Yes.   Postural symptoms:  Yes.    Falls?  Yes.  , last one 2-3 months ago (was more of a passing out than a fall.  Doesn't recall much about event).  Has  not had other falls Bradykinesia symptoms: difficulty getting out of a chair and difficulty regaining balance Loss of smell:  Yes.   Loss of taste:  No. Urinary Incontinence:  No. Difficulty Swallowing:  Yes.   (some trouble with pills but does better with taking with bananas/applesauce) Handwriting, micrographia: Yes.   (when she wrote here it didn't reflect this but reports that it often is little) Trouble with ADL's:  Yes.   (cannot tie shoes but otherwise does dress self)  Trouble buttoning clothing: Yes.   Depression:  No. Memory changes:  Yes.  , trouble with names Hallucinations:  Yes.  , in the past she had vivid hallucination and called police.  This was few years ago and hasn't had since (this was in 2015)  visual distortions: Yes.   N/V:  No. Lightheaded:  Yes.   (occasionally)  Syncope: Yes.   (in early June she had passing out episode and fell and hit back.  Now seeing spine for LBP) Diplopia:  No. Dyskinesia:  Yes.   (has dyskinesia during "on" periods and dystonia in "off" periods).  Pt reports that dystonia manifests as inversion of ankles bilaterally and then the "cramping" will progress up the body.  She states that she uses baclofen now  for that 10 mg tid.  Neuroimaging of the brain has  previously been performed.  It is available for my review today.  CT brain done in 05/16/17 and it was unremarkable for acute process.  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  I just slightly decreased her dosages of levodopa last visit because of dyskinesia which was bothersome to her.  She ultimately did not like that change and increased her medication back to carbidopa/levodopa 25/100, to 2 pills at 6 AM/9 AM/noon/3 PM and 1 at 5 PM if she needs that.  She is also on pramipexole, 0.25 mg 3 times per day and then takes 1 mg at night.  She has been to the emergency room numerous times since last visit.  She went in early November with a discomfort and  stiffness in her abdomen.  She felt that she was having shortness of breath.  The workup in the emergency room was negative.  She went to the emergency room at the end of November with right-sided facial droop.  This apparently resolved while EMS was present and had resolved by the time she got to the hospital.  MRI of the brain was attempted, but the patient could not tolerate it.  The neuro hospitalist saw the patient and felt his suspicion for stroke was low.  He felt that her symptoms could be due to missing her levodopa earlier that day.  She called me 2 days after hospital discharge to state that she was having blackout spells.  She reported that prior to hospital admission, she was in her living room and the next thing that she knew is that people were around her trying to get her open her eyes.  She thinks that she passed out before she had the facial droop that led to the hospitalization.  Ambulatory EEG was scheduled with my office.  She was agreeable, but then canceled it.  She went back to the hospital on October 31, 2017, after being found by a security guard at her facility.  There are no other records surrounding this.  The patient was examined by the emergency room, felt that she had missed several doses of levodopa and discharge from the emergency room was recommended.  Just before discharge, she complained that she was having difficulty swallowing.  A bedside swallowing evaluation was completed, which she did not pass and a dysphagia 3 (mechanical soft) diet was recommended.  Interestingly, the patient also complained of left-sided facial tightness during this ER stay.  She was ultimately discharged back to [facility], but to the skilled nursing facility portion instead of independent living.  She states that she is back in independent living.  She is still doing therapy.  She thinks that the therapy helps.  She is having hallucinations and that "is a real problem."  She feels that she is seeing  her son which is now deceased.  She does know it is not real.  She, however, feels that the "hallucinations are drawing me into their world."  She thinks that moods are good but admits to anxiety.   04/03/18 update: Patient is seen today in follow-up.  She is accompanied by a caregiver from [facility] who supplements the history.  I weaned her off of pramipexole since our last visit, primarily because of hallucinations.  She is still on carbidopa/levodopa 25/100, 2 pills at 6 AM/9 AM/noon/3 PM and an additional at 5 PM if needed.  She instead of taking the  1 tablet of 5pm, she takes 1/2 tablet at 6pm and 1/2 at 2:30am.  She is also on carbidopa/levodopa 50/200 at bed.   I have received numerous phone calls from her and Clio since our last visit.  Records have been reviewed.  She was in the emergency room on December 06, 2017 after a fall.  CT was nonacute and she was discharged from the emergency room.  I got a call in February from [facility] about episodes of tightness in the patient's diaphragm associated with shortness of breath (and have subsequently gotten several calls about this).  She had previously discussed with me similar episodes, as documented in other notes.  I did not think that these are related to Parkinson's disease, and had previously offered ambulatory EEG but she canceled it.  She did end up having that done on February 28 and it was normal.  This was done after [facility] had called and said that she has had episodes of mental status change, even with sternal rub.  I did recommend cardiology follow-up.  She was previously seen for paroxysmal atrial fibrillation by cardiology.  Told her that I saw no reason for her to have these from a neurologic standpoint, if vitals were good, which they were per the facility.  Patient was back in the emergency room on Mar 29, 2018 for spasms in her entire body.  She has been there previously for that.  Work-up was negative, including cardiac work-up.   She does have freezing of gait but only 2-3 times total since last visit.  Some occasional trouble with "morning on."  08/14/18 update: Patient is seen today in follow-up for Parkinson's disease.  She is on carbidopa/levodopa 25/100, 2 tablets at 6 AM/9 AM/noon/3 PM and another half tablet at 6 PM and a second half tablet in the middle of the night.  She does state that medication wears off in the evening and think that she needs more than 1/2 tablet.  She also takes carbidopa/levodopa 50/200 at bedtime.  She is also on selegeline 5 mg bid.  She admits to hallucinations but they may not be that.  States that she has them "while sleeping."  She cannot clearly tell me if they are part of dream or part of wake state.  "they seem to have a religious connotation."  She does state that she doesn't have them during the daytime.   She was given and approved for Inbrija last visit, but [facility] has a policy that the patient needed to have the medication in a lock box and, because of the Parkinson's disease, she is just unable to open a lock box.  She can have them storage, but by the time she calls them and they come, the freezing event is over.  She did call me in July to state that she had had an episode where her "memory was completely gone."  She called the police but did not remember doing it.  She had just been diagnosed with pneumonia as well.  I thought that the symptom was likely related to that as opposed to Parkinson's itself.  07/27/19 update: Patient seen today in follow-up for Parkinson's disease.  I have not seen her in about a year.  She is on carbidopa/levodopa 25/100, 2 tablets at 6 AM/9 AM/noon/3 PM and 1 tablet at 6 PM.  She does take an extra half tablet at 3 AM.  She is also on carbidopa/levodopa 50/200 at bedtime.  I stopped her selegiline last visit.  She has seen orthopedics regarding carpal tunnel.  I have reviewed Dr. Merrilee SeashoreKuzma's records.  PREVIOUS MEDICATIONS: Sinemet and MirapexRytary  (hallucinations); amantadine (hallucinations); stalevo (too expensive); selegeline  ALLERGIES:   Allergies  Allergen Reactions   Other Other (See Comments)    ANESTHESIA-HALLUCINATIONS   Thorazine [Chlorpromazine] Other (See Comments)    Unknown    CURRENT MEDICATIONS:  Outpatient Encounter Medications as of 07/27/2019  Medication Sig   acetaminophen (TYLENOL) 500 MG tablet Take 1,000 mg by mouth every 8 (eight) hours as needed for mild pain or moderate pain.    ALPRAZolam (XANAX) 0.25 MG tablet Take 1 tablet (0.25 mg total) by mouth daily as needed for anxiety. (Patient taking differently: Take 0.25 mg by mouth 3 (three) times daily. )   ALPRAZolam (XANAX) 0.25 MG tablet Take 0.25 mg by mouth daily as needed for anxiety.   apixaban (ELIQUIS) 5 MG TABS tablet Take 5 mg by mouth 2 (two) times daily.   baclofen (LIORESAL) 10 MG tablet Take 1 tablet (10 mg total) by mouth 3 (three) times daily as needed for muscle spasms (leg muscle spasms). (Patient not taking: Reported on 01/09/2019)   budesonide (PULMICORT) 0.5 MG/2ML nebulizer solution Take 2 mLs (0.5 mg total) by nebulization 2 (two) times daily.   calcitonin, salmon, (MIACALCIN/FORTICAL) 200 UNIT/ACT nasal spray Place 1 spray into alternate nostrils daily.   carbidopa-levodopa (SINEMET CR) 50-200 MG tablet Take 1 tablet by mouth at bedtime. (Patient taking differently: Take 1 tablet by mouth every 8 (eight) hours. At 0600, 1400, 2200)   carbidopa-levodopa (SINEMET IR) 25-100 MG tablet 2 tablets at 6 am, 2 tablets at 9 am, 2 tablets at 12 pm, 2 tablets at 3 pm, 1 PRN at 5 pm (Patient taking differently: Take 1 tablet by mouth daily as needed (spasma). )   carbidopa-levodopa (SINEMET IR) 25-100 MG tablet Take 1 tablet by mouth 3 (three) times daily. At 1000, 1800, 0300   cyanocobalamin (,VITAMIN B-12,) 1000 MCG/ML injection Inject 1 mL (1,000 mcg total) into the muscle every 30 (thirty) days. (Patient not taking: Reported on  01/09/2019)   formoterol (PERFOROMIST) 20 MCG/2ML nebulizer solution 2ml by nebulizer two time daily (Patient taking differently: Take 20 mcg by nebulization 2 (two) times daily. )   Levodopa (INBRIJA) 42 MG CAPS Place 1 capsule into inhaler and inhale as needed. (Patient not taking: Reported on 01/09/2019)   loratadine (CLARITIN) 10 MG tablet Take by mouth.   meloxicam (MOBIC) 15 MG tablet Take 15 mg by mouth daily.   Multiple Vitamin (MULTI-VITAMINS) TABS Take 1 tablet by mouth daily.   Polyethyl Glycol-Propyl Glycol (SYSTANE) 0.4-0.3 % SOLN Place 1 drop into both eyes 4 (four) times daily.   sertraline (ZOLOFT) 25 MG tablet Take 25 mg by mouth daily.   traMADol (ULTRAM) 50 MG tablet Take 1 tablet (50 mg total) by mouth every 6 (six) hours as needed for moderate pain or severe pain. (Patient not taking: Reported on 01/09/2019)   No facility-administered encounter medications on file as of 07/27/2019.     PAST MEDICAL HISTORY:   Past Medical History:  Diagnosis Date   Anxiety    Atrial fibrillation (HCC)    Macular degeneration    Parkinson's disease (HCC)    Pulmonary embolism (HCC)     PAST SURGICAL HISTORY:   Past Surgical History:  Procedure Laterality Date   BACK SURGERY  2017   x3   CATARACT EXTRACTION Right    EXTERNAL FIXATION ARM  SOCIAL HISTORY:   Social History   Socioeconomic History   Marital status: Widowed    Spouse name: Not on file   Number of children: Not on file   Years of education: Not on file   Highest education level: Not on file  Occupational History   Not on file  Social Needs   Financial resource strain: Not on file   Food insecurity    Worry: Not on file    Inability: Not on file   Transportation needs    Medical: Not on file    Non-medical: Not on file  Tobacco Use   Smoking status: Never Smoker   Smokeless tobacco: Never Used  Substance and Sexual Activity   Alcohol use: Yes    Comment: occasional wine     Drug use: No   Sexual activity: Not on file  Lifestyle   Physical activity    Days per week: Not on file    Minutes per session: Not on file   Stress: Not on file  Relationships   Social connections    Talks on phone: Not on file    Gets together: Not on file    Attends religious service: Not on file    Active member of club or organization: Not on file    Attends meetings of clubs or organizations: Not on file    Relationship status: Not on file   Intimate partner violence    Fear of current or ex partner: Not on file    Emotionally abused: Not on file    Physically abused: Not on file    Forced sexual activity: Not on file  Other Topics Concern   Not on file  Social History Narrative   Not on file    FAMILY HISTORY:   Family Status  Relation Name Status   Mother  Deceased   Father  Deceased   Sister  (Not Specified)   Son x2 Alive    ROS:  ROS   PHYSICAL EXAMINATION:    VITALS:   There were no vitals filed for this visit.  GEN:  The patient appears stated age and is in NAD. HEENT:  Normocephalic, atraumatic.  The mucous membranes are moist. The superficial temporal arteries are without ropiness or tenderness. CV:  RRR Lungs:  CTAB Neck/HEME:  There are no carotid bruits bilaterally.  Neurological examination:  Orientation:  Montreal Cognitive Assessment  07/24/2017  Visuospatial/ Executive (0/5) 5  Naming (0/3) 2  Attention: Read list of digits (0/2) 2  Attention: Read list of letters (0/1) 1  Attention: Serial 7 subtraction starting at 100 (0/3) 0  Language: Repeat phrase (0/2) 2  Language : Fluency (0/1) 1  Abstraction (0/2) 2  Delayed Recall (0/5) 2  Orientation (0/6) 6  Total 23  Adjusted Score (based on education) 23   Orientation: The patient is alert and oriented x3. Cranial nerves: There is good facial symmetry. The speech is fluent and clear. Soft palate rises symmetrically and there is no tongue deviation. Hearing is intact  to conversational tone. Sensation: Sensation is intact to light touch throughout Motor: Strength is 5/5 in the bilateral upper and lower extremities.   Shoulder shrug is equal and symmetric.  There is no pronator drift.  Movement examination: Tone: There is normal tone in the bilateral upper extremities.  The tone in the lower extremities is normal.  Abnormal movements: There is mod axial dyskinesia and bilateral UE and LLE Coordination:  There is good RAMs with  any form of RAMS, including alternating supination and pronation of the forearm, hand opening and closing (mild trouble with this only due to arthritis), finger taps, heel taps and toe taps.. Gait and Station: she was given a routine walker and had trouble with that.  She did better with the U step walker.  She is wide based and ataxic.  Labs:  Her B12 on 04/02/2017 at [facility] was 290    ASSESSMENT/PLAN:  1.  Idiopathic Parkinson's disease.  The patient was dx in 1998.    -while patient has done incredibly well, she is bothered by on dyskinesia and off dystonia.  Amantadine has caused hallucinations in the past.  Marilynne Drivers has talked to her extensively about DBS therapy as well as duopa.  I've addressed as well She doesn't feel that she could handle mentally DBS and doesn't think that she could physically handle the duopa tube.  She has been previously given literature on both of these topics.  We talked about the pump patch system in trial.    -unable to take inbrija due to requirement from facility that this needs to be in a lockbox and she cannot open the lockbox.  She didn't think that the samples were helpful either.  -She will continue carbidopa/levodopa 25/100, 2 tablets at 6 AM/9 AM/noon/3 PM and will increase 6 pm dosage to 1 full tablet.  Takes 1/2 tablet at 3am.    -She will continue carbidopa/levodopa 50/200 at bedtime.   -We discussed that it used to be thought that levodopa would increase risk of melanoma but now it is  believed that Parkinsons itself likely increases risk of melanoma. she is to get regular skin checks. 2.  B12 deficiency  -on b12 injections  3.  Facial droop  -The records that were made available to me were reviewed and thus far, work up for stroke negative and when she has this she has missed her carbidopa/levodopa.  I wonder if this represents off dystonia.  The neurohospitalist reported low suspicion for stroke  4.  Abdominal pain/spasms and diffuse, full body muscle spasm.  -Explained to her that I do not think that these are from Parkinson's disease.  Explained that while Parkinson's disease can cause cramping (often times in the feet and leg at night), it is otherwise not a painful disease and does not cause diffuse full body muscle spasms.  I told her that she would need to explore this etiology/solution with a different physician.  She will start with her primary care physician.  5.  Follow up is anticipated in the next few months, sooner should new neurologic issues arise.  Much greater than 50% of this visit was spent in counseling and coordinating care.  Total face to face time:  25 min   Cc:  [name], MD

## 2019-07-27 ENCOUNTER — Ambulatory Visit: Payer: Medicare Other | Admitting: Neurology
